# Patient Record
Sex: Female | Born: 1950 | Race: White | Hispanic: No | Marital: Married | Smoking: Never smoker
Health system: Southern US, Community
[De-identification: ages and names within clinical notes are randomized; demographics above are authoritative.]

## PROBLEM LIST (undated history)

## (undated) DIAGNOSIS — R112 Nausea with vomiting, unspecified: Secondary | ICD-10-CM

## (undated) DIAGNOSIS — N952 Postmenopausal atrophic vaginitis: Secondary | ICD-10-CM

## (undated) DIAGNOSIS — Z9889 Other specified postprocedural states: Secondary | ICD-10-CM

## (undated) DIAGNOSIS — M1710 Unilateral primary osteoarthritis, unspecified knee: Secondary | ICD-10-CM

## (undated) DIAGNOSIS — R011 Cardiac murmur, unspecified: Secondary | ICD-10-CM

## (undated) DIAGNOSIS — N6019 Diffuse cystic mastopathy of unspecified breast: Secondary | ICD-10-CM

## (undated) HISTORY — DX: Other specified postprocedural states: Z98.890

## (undated) HISTORY — DX: Postmenopausal atrophic vaginitis: N95.2

## (undated) HISTORY — DX: Unilateral primary osteoarthritis, unspecified knee: M17.10

## (undated) HISTORY — DX: Diffuse cystic mastopathy of unspecified breast: N60.19

## (undated) HISTORY — PX: OTHER SURGICAL HISTORY: SHX169

## (undated) HISTORY — PX: PELVIC LAPAROSCOPY: SHX162

## (undated) HISTORY — DX: Cardiac murmur, unspecified: R01.1

## (undated) HISTORY — PX: OOPHORECTOMY: SHX86

## (undated) HISTORY — PX: DILATION AND CURETTAGE OF UTERUS: SHX78

## (undated) HISTORY — DX: Other specified postprocedural states: R11.2

## (undated) HISTORY — PX: ECTOPIC PREGNANCY SURGERY: SHX613

---
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
## 1982-01-21 HISTORY — PX: OTHER SURGICAL HISTORY: SHX169

## 1982-01-21 HISTORY — PX: BREAST BIOPSY: SHX20

## 1991-01-22 HISTORY — PX: BREAST BIOPSY: SHX20

## 1997-06-28 ENCOUNTER — Other Ambulatory Visit: Admission: RE | Admit: 1997-06-28 | Discharge: 1997-06-28 | Payer: Self-pay | Admitting: Obstetrics and Gynecology

## 1997-11-08 ENCOUNTER — Other Ambulatory Visit: Admission: RE | Admit: 1997-11-08 | Discharge: 1997-11-08 | Payer: Self-pay | Admitting: Obstetrics and Gynecology

## 1998-11-09 ENCOUNTER — Other Ambulatory Visit: Admission: RE | Admit: 1998-11-09 | Discharge: 1998-11-09 | Payer: Self-pay | Admitting: Obstetrics and Gynecology

## 1999-11-20 ENCOUNTER — Other Ambulatory Visit: Admission: RE | Admit: 1999-11-20 | Discharge: 1999-11-20 | Payer: Self-pay | Admitting: Obstetrics and Gynecology

## 2000-11-19 ENCOUNTER — Other Ambulatory Visit: Admission: RE | Admit: 2000-11-19 | Discharge: 2000-11-19 | Payer: Self-pay | Admitting: Obstetrics and Gynecology

## 2001-11-20 ENCOUNTER — Other Ambulatory Visit: Admission: RE | Admit: 2001-11-20 | Discharge: 2001-11-20 | Payer: Self-pay | Admitting: Obstetrics and Gynecology

## 2002-11-30 ENCOUNTER — Other Ambulatory Visit: Admission: RE | Admit: 2002-11-30 | Discharge: 2002-11-30 | Payer: Self-pay | Admitting: Obstetrics and Gynecology

## 2003-12-06 ENCOUNTER — Other Ambulatory Visit: Admission: RE | Admit: 2003-12-06 | Discharge: 2003-12-06 | Payer: Self-pay | Admitting: Obstetrics and Gynecology

## 2004-12-06 ENCOUNTER — Other Ambulatory Visit: Admission: RE | Admit: 2004-12-06 | Discharge: 2004-12-06 | Payer: Self-pay | Admitting: Addiction Medicine

## 2005-12-09 ENCOUNTER — Other Ambulatory Visit: Admission: RE | Admit: 2005-12-09 | Discharge: 2005-12-09 | Payer: Self-pay | Admitting: Obstetrics and Gynecology

## 2006-12-11 ENCOUNTER — Other Ambulatory Visit: Admission: RE | Admit: 2006-12-11 | Discharge: 2006-12-11 | Payer: Self-pay | Admitting: Obstetrics and Gynecology

## 2007-12-02 ENCOUNTER — Ambulatory Visit: Payer: Self-pay | Admitting: Obstetrics and Gynecology

## 2007-12-11 ENCOUNTER — Encounter: Payer: Self-pay | Admitting: Obstetrics and Gynecology

## 2007-12-11 ENCOUNTER — Ambulatory Visit: Payer: Self-pay | Admitting: Obstetrics and Gynecology

## 2007-12-11 ENCOUNTER — Other Ambulatory Visit: Admission: RE | Admit: 2007-12-11 | Discharge: 2007-12-11 | Payer: Self-pay | Admitting: Obstetrics and Gynecology

## 2008-12-08 ENCOUNTER — Ambulatory Visit: Payer: Self-pay | Admitting: Obstetrics and Gynecology

## 2009-01-02 ENCOUNTER — Ambulatory Visit: Payer: Self-pay | Admitting: Obstetrics and Gynecology

## 2009-01-02 ENCOUNTER — Other Ambulatory Visit: Admission: RE | Admit: 2009-01-02 | Discharge: 2009-01-02 | Payer: Self-pay | Admitting: Obstetrics and Gynecology

## 2009-05-04 ENCOUNTER — Encounter (INDEPENDENT_AMBULATORY_CARE_PROVIDER_SITE_OTHER): Payer: Self-pay | Admitting: *Deleted

## 2009-05-05 ENCOUNTER — Ambulatory Visit: Payer: Self-pay | Admitting: Gastroenterology

## 2009-05-24 HISTORY — PX: COLONOSCOPY: SHX174

## 2009-05-29 ENCOUNTER — Telehealth: Payer: Self-pay | Admitting: Gastroenterology

## 2009-06-13 ENCOUNTER — Ambulatory Visit: Payer: Self-pay | Admitting: Gastroenterology

## 2009-07-19 ENCOUNTER — Ambulatory Visit: Payer: Self-pay | Admitting: Obstetrics and Gynecology

## 2009-12-26 ENCOUNTER — Ambulatory Visit: Payer: Self-pay | Admitting: Obstetrics and Gynecology

## 2010-01-03 ENCOUNTER — Other Ambulatory Visit
Admission: RE | Admit: 2010-01-03 | Discharge: 2010-01-03 | Payer: Self-pay | Source: Home / Self Care | Admitting: Obstetrics and Gynecology

## 2010-01-03 ENCOUNTER — Ambulatory Visit: Payer: Self-pay | Admitting: Obstetrics and Gynecology

## 2010-02-20 NOTE — Procedures (Signed)
Summary: Colonoscopy  Patient: [name] Note: All result statuses are Final unless otherwise noted.  Tests: (1) Colonoscopy (COL)   COL Colonoscopy           DONE     [facility]     [address]     [place], [place]  [address]           COLONOSCOPY PROCEDURE REPORT           PATIENT:  Doris Hanson, Doris Hanson  MR#:  [id number]     BIRTHDATE:  02/19/1950, 58 yrs. old  GENDER:  female     ENDOSCOPIST:  Rachael Fee, MD     REF. BY:  Herb Grays, M.D.     PROCEDURE DATE:  06/13/2009     PROCEDURE:  Diagnostic Colonoscopy     ASA CLASS:  Class II     INDICATIONS:  Routine Risk Screening     MEDICATIONS:   Fentanyl 75 mcg IV, Versed 9 mg IV           DESCRIPTION OF PROCEDURE:   After the risks benefits and     alternatives of the procedure were thoroughly explained, informed     consent was obtained.  Digital rectal exam was performed and     revealed no rectal masses.   The LB [id number]-[id number] endoscope     was introduced through the anus and advanced to the cecum, which     was identified by both the appendix and ileocecal valve, without     limitations.  The quality of the prep was excellent, using     MoviPrep.  The instrument was then slowly withdrawn as the colon     was fully examined.     <<PROCEDUREIMAGES>>           FINDINGS:  A normal appearing cecum, ileocecal valve, and     appendiceal orifice were identified. The ascending, hepatic     flexure, transverse, splenic flexure, descending, sigmoid colon,     and rectum appeared unremarkable (see image1, image2, and image3).     Retroflexed views in the rectum revealed no abnormalities.    The     scope was then withdrawn from the patient and the procedure     completed.           COMPLICATIONS:  None           ENDOSCOPIC IMPRESSION:     1) Normal colon     2) No polyps or cancers           RECOMMENDATIONS:     1) Continue current colorectal screening recommendations for     "routine risk" patients with  a repeat colonoscopy in 10 years.           REPEAT EXAM:  10 years           ______________________________     Rachael Fee, MD           n.     eSIGNED:   Rachael Fee at 06/13/2009 11:24 AM           Norm Salt, [id number]  Note: An exclamation mark (!) indicates a result that was not dispersed into the flowsheet. Document Creation Date: 06/13/2009 11:25 AM _______________________________________________________________________  (1) Order result status: Final Collection or observation date-time: 06/13/2009 11:21 Requested date-time:  Receipt date-time:  Reported date-time:  Referring Physician:   Ordering Physician: Rob Bunting [phone]) Specimen Source:  Source: Kem Parkinson  Filler Order Number: [id number] Lab site:   Appended Document: Colonoscopy    Clinical Lists Changes  Observations: Added new observation of COLONNXTDUE: 05/2019 (06/13/2009 13:10)

## 2010-02-20 NOTE — Miscellaneous (Signed)
Summary: [facility]  Clinical Lists Changes  Medications: Added new medication of MOVIPREP 100 GM  SOLR (PEG-KCL-NACL-NASULF-NA ASC-C) As per prep instructions. - Signed Rx of MOVIPREP 100 GM  SOLR (PEG-KCL-NACL-NASULF-NA ASC-C) As per prep instructions.;  #1 x 0;  Signed;  Entered by: Ezra Sites RN;  Authorized by: Rachael Fee MD;  Method used: [facility]  [place] [address], [place], [place]  [address], Ph: [phone] or [phone], Fax: [phone] Allergies: Added new allergy or adverse reaction of HYDROCODONE Observations: Added new observation of NKA: F (05/05/2009 9:58)    Prescriptions: MOVIPREP 100 GM  SOLR (PEG-KCL-NACL-NASULF-NA ASC-C) As per prep instructions.  #1 x 0   Entered by:   Ezra Sites RN   Authorized by:   Rachael Fee MD   Signed by:   Ezra Sites RN on 05/05/2009   Method used:   [facility]        [facility]  [place] [address] (retail)       [address]       [place], [place]  [address]       Ph: [phone] or [phone]       Fax: [phone]   RxID:   [id number]

## 2010-02-20 NOTE — Progress Notes (Signed)
Summary: ? re prep  Phone Note Call from Patient Call back at Home Phone [phone]   Caller: Patient Call For: Christella Hartigan Reason for Call: Talk to Nurse Summary of Call: Patient has questions regarding prep Initial call taken by: Tawni Levy,  May 29, 2009 9:56 AM  Follow-up for Phone Call        Pt. has a meeting the evening before procedure she can not miss and asked if it was ok to take her prep at 8PM.  Advised that it would be ok, but she needed to be sure she has at least a cup of clear liquids every hour while awake to prevent dehydration.  Pt. verbalized understanding of instructions. Follow-up by: Wyona Almas RN,  May 29, 2009 10:11 AM

## 2010-02-20 NOTE — Letter (Signed)
Summary: [facility] Instructions  [facility]  [address] [place], [place] [address]   Phone: [phone]  Fax: [phone]       Doris Hanson    May 19, 1950    MRN: [id number]        Procedure Day /Date: Tuesday May 24/2011     Arrival Time: 9:30 am      Procedure Time: 10:30 am     Location of Procedure:                    _ x_  [facility] (4th Floor)                        PREPARATION FOR COLONOSCOPY WITH MOVIPREP   Starting 5 days prior to your procedure Thursday 5/19 do not eat nuts, seeds, popcorn, corn, beans, peas,  salads, or any raw vegetables.  Do not take any fiber supplements (e.g. Metamucil, Citrucel, and Benefiber).  THE DAY BEFORE YOUR PROCEDURE         DATE: Monday 5/23  1.  Drink clear liquids the entire day-NO SOLID FOOD  2.  Do not drink anything colored red or purple.  Avoid juices with pulp.  No orange juice.  3.  Drink at least 64 oz. (8 glasses) of fluid/clear liquids during the day to prevent dehydration and help the prep work efficiently.  CLEAR LIQUIDS INCLUDE: Water Jello Ice Popsicles Tea (sugar ok, no milk/cream) Powdered fruit flavored drinks Coffee (sugar ok, no milk/cream) Gatorade Juice: apple, white grape, white cranberry  Lemonade Clear bullion, consomm, broth Carbonated beverages (any kind) Strained chicken noodle soup Hard Candy                             4.  In the morning, mix first dose of MoviPrep solution:    Empty 1 Pouch A and 1 Pouch B into the disposable container    Add lukewarm drinking water to the top line of the container. Mix to dissolve    Refrigerate (mixed solution should be used within 24 hrs)  5.  Begin drinking the prep at 5:00 p.m. The MoviPrep container is divided by 4 marks.   Every 15 minutes drink the solution down to the next mark (approximately 8 oz) until the full liter is complete.   6.  Follow completed prep with 16 oz of clear liquid of your choice (Nothing  red or purple).  Continue to drink clear liquids until bedtime.  7.  Before going to bed, mix second dose of MoviPrep solution:    Empty 1 Pouch A and 1 Pouch B into the disposable container    Add lukewarm drinking water to the top line of the container. Mix to dissolve    Refrigerate  THE DAY OF YOUR PROCEDURE      DATE: Tuesday 5/24  Beginning at 5:30 a.m. (5 hours before procedure):         1. Every 15 minutes, drink the solution down to the next mark (approx 8 oz) until the full liter is complete.  2. Follow completed prep with 16 oz. of clear liquid of your choice.    3. You may drink clear liquids until 8:30 am (2 HOURS BEFORE PROCEDURE).   MEDICATION INSTRUCTIONS  Unless otherwise instructed, you should take regular prescription medications with a small sip of water   as early as possible the morning of  your procedure.        OTHER INSTRUCTIONS  You will need a responsible adult at least 58 years of age to accompany you and drive you home.   This person must remain in the waiting room during your procedure.  Wear loose fitting clothing that is easily removed.  Leave jewelry and other valuables at home.  However, you may wish to bring a book to read or  an iPod/MP3 player to listen to music as you wait for your procedure to start.  Remove all body piercing jewelry and leave at home.  Total time from sign-in until discharge is approximately 2-3 hours.  You should go home directly after your procedure and rest.  You can resume normal activities the  day after your procedure.  The day of your procedure you should not:   Drive   Make legal decisions   Operate machinery   Drink alcohol   Return to work  You will receive specific instructions about eating, activities and medications before you leave.    The above instructions have been reviewed and explained to me by   Ezra Sites RN  May 05, 2009 10:27 AM    I fully understand and can verbalize  these instructions _____________________________ Date _________

## 2010-11-19 ENCOUNTER — Telehealth: Payer: Self-pay | Admitting: *Deleted

## 2010-11-19 NOTE — Telephone Encounter (Signed)
Patient calling asking to get her labs done prior to her annual exam appointment on 01/10/11.  She said she usually comes a week prior and gets a FLP and HgbA1C.  Can we order for her?

## 2010-11-19 NOTE — Telephone Encounter (Signed)
Chart please 

## 2010-11-20 NOTE — Telephone Encounter (Signed)
Lm for patient to call

## 2010-11-20 NOTE — Telephone Encounter (Signed)
Gave chart yesterday. I still had encounter open.

## 2010-11-20 NOTE — Telephone Encounter (Signed)
CBC, lipid profile, hemoglobin A1c. Make sure patient is fasted overnight.

## 2010-11-21 ENCOUNTER — Telehealth: Payer: Self-pay | Admitting: *Deleted

## 2010-11-21 DIAGNOSIS — Z0000 Encounter for general adult medical examination without abnormal findings: Secondary | ICD-10-CM

## 2010-11-21 NOTE — Telephone Encounter (Signed)
Per telephone encounter on 11/20/10 DG is okay with pt  having her labs drawn prior to her annual exam. Order placed in computer

## 2010-11-27 NOTE — Telephone Encounter (Signed)
Patient informed. 

## 2010-12-24 DIAGNOSIS — O009 Unspecified ectopic pregnancy without intrauterine pregnancy: Secondary | ICD-10-CM | POA: Insufficient documentation

## 2010-12-25 ENCOUNTER — Encounter: Payer: Self-pay | Admitting: Gynecology

## 2011-01-03 ENCOUNTER — Other Ambulatory Visit (INDEPENDENT_AMBULATORY_CARE_PROVIDER_SITE_OTHER): Payer: BC Managed Care – PPO | Admitting: *Deleted

## 2011-01-03 DIAGNOSIS — Z13220 Encounter for screening for lipoid disorders: Secondary | ICD-10-CM

## 2011-01-03 DIAGNOSIS — Z0000 Encounter for general adult medical examination without abnormal findings: Secondary | ICD-10-CM

## 2011-01-04 LAB — HEMOGLOBIN A1C: Mean Plasma Glucose: 117 mg/dL — ABNORMAL HIGH (ref <117)

## 2011-01-10 ENCOUNTER — Encounter: Payer: Self-pay | Admitting: Obstetrics and Gynecology

## 2011-01-10 ENCOUNTER — Other Ambulatory Visit (HOSPITAL_COMMUNITY)
Admission: RE | Admit: 2011-01-10 | Discharge: 2011-01-10 | Disposition: A | Discharge status: Home / Self Care | Payer: BC Managed Care – PPO | Source: Ambulatory Visit | Attending: Obstetrics and Gynecology | Admitting: Obstetrics and Gynecology

## 2011-01-10 ENCOUNTER — Ambulatory Visit (INDEPENDENT_AMBULATORY_CARE_PROVIDER_SITE_OTHER): Payer: BC Managed Care – PPO | Admitting: Obstetrics and Gynecology

## 2011-01-10 VITALS — BP 120/74 | Ht 62.0 in | Wt 118.0 lb

## 2011-01-10 DIAGNOSIS — R82998 Other abnormal findings in urine: Secondary | ICD-10-CM

## 2011-01-10 DIAGNOSIS — Z01419 Encounter for gynecological examination (general) (routine) without abnormal findings: Secondary | ICD-10-CM | POA: Insufficient documentation

## 2011-01-10 DIAGNOSIS — N952 Postmenopausal atrophic vaginitis: Secondary | ICD-10-CM | POA: Insufficient documentation

## 2011-01-10 MED ORDER — ALENDRONATE SODIUM 70 MG PO TABS: 70.0000 mg | ORAL_TABLET | ORAL | Status: DC

## 2011-01-10 NOTE — Progress Notes (Signed)
Patient came to see me today for her annual GYN exam. She is fine without menopausal symptoms. She is up-to-date on mammograms. She remains on Fosamax for treatment of osteoporosis. She does well on her without side effects. She has been on for 5-6 years. She's had no fractures. She is due for her next bone density in one year. She is having no vaginal bleeding. She is having no pelvic pain. Her triglycerides were slightly elevated this month.  Physical examination:  Doris Hanson present. HEENT within normal limits. Neck: Thyroid not large. No masses. Supraclavicular nodes: not enlarged. Breasts: Examined in both sitting midline position. No skin changes and no masses. Abdomen: Soft no guarding rebound or masses or hernia. Pelvic: External: Within normal limits. BUS: Within normal limits. Vaginal:within normal limits. Good estrogen effect. No evidence of cystocele rectocele or enterocele. Cervix: clean. Uterus: Normal size and shape. Adnexa: No masses. Rectovaginal exam: Confirmatory and negative. Extremities: Within normal limits.  Assessment: #1. Osteoporosis #2. Slightly elevated triglycerides  Plan: Discussed low-fat diet. Recheck lipid profile in 6 months. Continue yearly mammograms. Bone density next year.

## 2011-01-10 NOTE — Progress Notes (Signed)
Addended byCammie Mcgee T on: 01/10/2011 03:19 PM   Modules accepted: Orders

## 2011-01-12 LAB — URINE CULTURE: Colony Count: 2000

## 2011-05-01 ENCOUNTER — Other Ambulatory Visit: Payer: Self-pay | Admitting: *Deleted

## 2011-06-25 ENCOUNTER — Other Ambulatory Visit: Payer: Self-pay | Admitting: Obstetrics and Gynecology

## 2011-06-25 ENCOUNTER — Other Ambulatory Visit: Payer: Self-pay | Admitting: *Deleted

## 2011-06-25 DIAGNOSIS — R928 Other abnormal and inconclusive findings on diagnostic imaging of breast: Secondary | ICD-10-CM

## 2011-06-27 ENCOUNTER — Encounter: Payer: Self-pay | Admitting: Obstetrics and Gynecology

## 2011-07-02 ENCOUNTER — Ambulatory Visit (INDEPENDENT_AMBULATORY_CARE_PROVIDER_SITE_OTHER): Payer: BC Managed Care – PPO | Admitting: Obstetrics and Gynecology

## 2011-07-02 DIAGNOSIS — M810 Age-related osteoporosis without current pathological fracture: Secondary | ICD-10-CM

## 2011-07-02 NOTE — Progress Notes (Signed)
I asked the patient to come back today because of significant bone loss on her last bone density. Her worst T score is now -3.5. Since her previous bone density she has lost 5.4% bone in her spine which is statistically significant. She has been on Fosamax since March of 06 and takes it regularly. She takes adequate calcium and vitamin D. She is a nonsmoker. She drinks alcohol in moderation. She has had no fractures. She has had some back pain which they have attributed to nerve  irritation. It is partially responded to Celebrex. She has not lost any height.  Assessment: Worsening osteoporosis in spite of treatment.  Plan: We went over all the above in detail. Lab work was drawn for secondary causes of  bone loss. She will have her internist x-ray her for a vertebral compression fracture. Assuming all the above is normal we will get her approved for Prolia. Total time of interview was 30 minutes.

## 2011-07-03 LAB — COMPREHENSIVE METABOLIC PANEL
ALT: 16 U/L (ref 0–35)
AST: 21 U/L (ref 0–37)
Albumin: 4.4 g/dL (ref 3.5–5.2)
CO2: 30 mEq/L (ref 19–32)
Calcium: 9.9 mg/dL (ref 8.4–10.5)
Chloride: 102 mEq/L (ref 96–112)
Creat: 0.85 mg/dL (ref 0.50–1.10)
Potassium: 4.4 mEq/L (ref 3.5–5.3)
Sodium: 141 mEq/L (ref 135–145)
Total Protein: 7.1 g/dL (ref 6.0–8.3)

## 2011-07-03 LAB — GLIA (IGA/G) + TTG IGA
Gliadin IgA: 3.7 U/mL (ref <20)
Gliadin IgG: 6.2 U/mL (ref <20)

## 2011-07-15 ENCOUNTER — Telehealth: Payer: Self-pay | Admitting: *Deleted

## 2011-07-15 NOTE — Telephone Encounter (Signed)
Pt asked if order for spine x-ray to be faxed to Dr.Spear office her appointment is tomorrow at 1:30, nancy signed order faxed.

## 2011-07-22 ENCOUNTER — Telehealth: Payer: Self-pay

## 2011-07-22 NOTE — Telephone Encounter (Signed)
You need to send this to Amy.

## 2011-07-22 NOTE — Telephone Encounter (Signed)
PT. NOTIFIED AT HER REQUEST ALL LABS NORMAL AND IS READY TO START PROLIA. SHE STATES SHE HAS PLENTY OF FOSAMAX RX UNTIL WE ARE ABLE TO GET PROLIA FOR HER.

## 2011-07-24 ENCOUNTER — Other Ambulatory Visit: Payer: Self-pay | Admitting: *Deleted

## 2011-07-24 MED ORDER — RISEDRONATE SODIUM 150 MG PO TABS
150.0000 mg | ORAL_TABLET | ORAL | Status: DC
Start: 2011-07-24 — End: 2012-01-17

## 2011-07-24 NOTE — Telephone Encounter (Signed)
Patient advised Prolia benefits.  Total cost would go to deductable. Patient states she does not want to go with this option and wants to know if there is another option besides Fosamax, otherwise for now she will stay with it.

## 2011-07-24 NOTE — Telephone Encounter (Signed)
I think Fosamax is a bad option since she has lost bone on it. Actonel 150 mg monthly would be a reasonable option. She should take it the way she takes Fosamax.

## 2011-07-24 NOTE — Telephone Encounter (Signed)
Patient informed.  Cancelled rx for Fosamax and sent new one to [facility] for Actonel 150mg .

## 2011-07-30 ENCOUNTER — Telehealth: Payer: Self-pay | Admitting: *Deleted

## 2011-07-30 ENCOUNTER — Other Ambulatory Visit: Payer: Self-pay | Admitting: *Deleted

## 2011-07-30 DIAGNOSIS — M810 Age-related osteoporosis without current pathological fracture: Secondary | ICD-10-CM

## 2011-07-30 MED ORDER — DENOSUMAB 60 MG/ML ~~LOC~~ SOLN
60.0000 mg | Freq: Once | SUBCUTANEOUS | Status: AC
  Administered 2012-05-01: 60 mg via SUBCUTANEOUS

## 2011-07-30 NOTE — Telephone Encounter (Signed)
Pt informed with the below note, will let AH know about the below.

## 2011-07-30 NOTE — Progress Notes (Signed)
Patient will have copay of $860.

## 2011-07-30 NOTE — Telephone Encounter (Signed)
Patient has decided to go with Prolia Injection.  Her cost would go straight to deductable.  She would have a $860 copay.  Ordered Prolia today and will call patient when it comes in to set up injection appt.

## 2011-07-30 NOTE — Telephone Encounter (Signed)
First of all Prolia is a 6 month shot. I therefore am  not sure where the number $800 for 2 months came from. Since she had lost bone on Fosamax and Actonel is in the same family I. Would prefer Prolia.

## 2011-07-30 NOTE — Telephone Encounter (Signed)
(  follow up from telephone 07/22/11) Pt was notified by [facility] that the Actonel 150 mg would be $400 for 3 months her prolia would be $800 for 2 months, not much money saved with Actonel, pt would like to know which one you would prefer her to take? Please advise

## 2011-07-31 NOTE — Telephone Encounter (Signed)
LM medication came in today

## 2011-08-06 NOTE — Telephone Encounter (Signed)
Late documentation. LM for pt on 7/12 after she left a message that she got my message regarding her Prolia

## 2011-08-13 ENCOUNTER — Other Ambulatory Visit: Payer: Self-pay | Admitting: *Deleted

## 2011-08-13 DIAGNOSIS — M810 Age-related osteoporosis without current pathological fracture: Secondary | ICD-10-CM

## 2011-08-13 MED ORDER — DENOSUMAB 60 MG/ML ~~LOC~~ SOLN
60.0000 mg | Freq: Once | SUBCUTANEOUS | Status: AC
  Administered 2011-08-14: 60 mg via SUBCUTANEOUS

## 2011-08-13 NOTE — Progress Notes (Signed)
Patient has $860 copay for Prolia.  Patient is aware.

## 2011-08-13 NOTE — Telephone Encounter (Signed)
Patient aware of copay and shot available.  Appt made for 08/14/11.

## 2011-08-14 ENCOUNTER — Ambulatory Visit (INDEPENDENT_AMBULATORY_CARE_PROVIDER_SITE_OTHER): Payer: BC Managed Care – PPO | Admitting: Gynecology

## 2011-08-14 DIAGNOSIS — M810 Age-related osteoporosis without current pathological fracture: Secondary | ICD-10-CM

## 2011-09-20 ENCOUNTER — Telehealth: Payer: Self-pay | Admitting: *Deleted

## 2011-09-20 NOTE — Telephone Encounter (Signed)
Message copied by Richardson Chiquito on Fri Sep 20, 2011  8:39 AM ------      Message from: Ladona Horns E      Created: Thu Sep 19, 2011  2:17 PM       I sent this to have $922.00 written off, patient still owes injection fee of about $42.00 as it was applied to her deductible.PH      ----- Message -----         From: Janus Molder, CMA         Sent: 09/19/2011   1:57 PM           To: Melvenia Beam            I guess I was asking if that's what we could do. Can I call her back and tell her that. I hope there were no others. Maciah Feeback      ----- Message -----         From: Melvenia Beam         Sent: 09/19/2011  12:45 PM           To: Janus Molder, CMA            Yes the prolia is charged out $1782.00. If she paid $880.00 it left her a balance of $922.00. If Amy failed to get this prior authorized we should write that amount off, is that what you are telling  Me ?      ----- Message -----         From: Janus Molder, CMA         Sent: 09/19/2011  12:38 PM           To: Melvenia Beam            This patient received Prolia from our office 08/14/11. When Amy checked benefits, she missed a prior authoriazation. The patient was told by Amy that she would have to pay $860. She paid that but now the patient states she is getting billed an additional $800 plus ( I think she said close to $900) Is this the mark up on the medication? Can you look at anything and let me know? She asked why she would be charged so much. Thanks Sherrilyn Rist

## 2011-09-20 NOTE — Telephone Encounter (Signed)
Pt informed of the write off and she is willing to pay the $42 injection fee.

## 2012-01-17 ENCOUNTER — Encounter: Payer: Self-pay | Admitting: Obstetrics and Gynecology

## 2012-01-17 ENCOUNTER — Ambulatory Visit (INDEPENDENT_AMBULATORY_CARE_PROVIDER_SITE_OTHER): Payer: BC Managed Care – PPO | Admitting: Obstetrics and Gynecology

## 2012-01-17 VITALS — BP 120/72 | Ht 62.0 in | Wt 120.0 lb

## 2012-01-17 DIAGNOSIS — Z01419 Encounter for gynecological examination (general) (routine) without abnormal findings: Secondary | ICD-10-CM

## 2012-01-17 NOTE — Progress Notes (Signed)
Patient came to see me today for her annual GYN exam. She remains amenorrheic after menopause. She requested medication for atrophic vaginitis. She has not previously used medication. She is currently not sexually active due to the vaginal dryness. She is up-to-date on her mammograms. She has had one injection of Prolia. She has done well with that but has had trouble with her nails chipping. She had a normal TSH this summer. Her last bone density was in 2011 and showed osteoporosis. This had happened in spite of 7 years of Fosamax. That is why we switched her medication. She had a normal mammogram this Summer. She has always had normal Pap smears. Her last Pap smear was 2012. In 1984 she had an ectopic pregnancy and had a laparoscopy with a linear left salpingostomy. This had occurred after previous microsurgical fimbrioplasty. The last time she had a lipid panel Was a year ago and was normal except for slightly elevated triglycerides at 176. She did not fast today.  Physical examination:Kim Perman present. HEENT within normal limits. Neck: Thyroid not large. No masses. Supraclavicular nodes: not enlarged. Breasts: Examined in both sitting and lying  position. No skin changes and no masses. Abdomen: Soft no guarding rebound or masses or hernia. Pelvic: External: Within normal limits. BUS: Within normal limits. Vaginal:within normal limits. Poor estrogen effect. No evidence of cystocele rectocele or enterocele. Cervix: clean. Uterus: Normal size and shape. Adnexa: No masses. Rectovaginal exam: Confirmatory and negative. Extremities: Within normal limits.  Assessment: #1. Atrophic vaginitis #2. Elevated triglycerides #3. Osteoporosis  Plan: Continue Prolia. Started patient on Vagifem-daily for 2 weeks then BIW. Return fasting for  lipid panel.Continue yearly mammograms. Plan bone density after 2-3 Prolia injections. Pap not done.The new Pap smear guidelines were discussed with the patient.

## 2012-01-17 NOTE — Patient Instructions (Signed)
Return fasting Monday for lab work.

## 2012-01-18 LAB — URINALYSIS W MICROSCOPIC + REFLEX CULTURE
Bacteria, UA: NONE SEEN
Bilirubin Urine: NEGATIVE
Casts: NONE SEEN
Crystals: NONE SEEN
Ketones, ur: NEGATIVE mg/dL
Nitrite: NEGATIVE
Specific Gravity, Urine: 1.013 (ref 1.005–1.030)
Squamous Epithelial / LPF: NONE SEEN
pH: 6.5 (ref 5.0–8.0)

## 2012-01-20 ENCOUNTER — Other Ambulatory Visit: Payer: BC Managed Care – PPO

## 2012-01-20 DIAGNOSIS — Z01419 Encounter for gynecological examination (general) (routine) without abnormal findings: Secondary | ICD-10-CM

## 2012-01-20 LAB — CBC WITH DIFFERENTIAL/PLATELET
Basophils Absolute: 0 10*3/uL (ref 0.0–0.1)
Basophils Relative: 0 % (ref 0–1)
Eosinophils Absolute: 0.1 10*3/uL (ref 0.0–0.7)
Eosinophils Relative: 2 % (ref 0–5)
HCT: 42.9 % (ref 36.0–46.0)
Hemoglobin: 14.2 g/dL (ref 12.0–15.0)
MCH: 29 pg (ref 26.0–34.0)
MCHC: 33.1 g/dL (ref 30.0–36.0)
Monocytes Absolute: 0.3 10*3/uL (ref 0.1–1.0)
Monocytes Relative: 6 % (ref 3–12)
Neutro Abs: 2.9 10*3/uL (ref 1.7–7.7)
RDW: 13 % (ref 11.5–15.5)

## 2012-01-20 LAB — LIPID PANEL
LDL Cholesterol: 126 mg/dL — ABNORMAL HIGH (ref 0–99)
Triglycerides: 110 mg/dL (ref <150)
VLDL: 22 mg/dL (ref 0–40)

## 2012-02-04 ENCOUNTER — Other Ambulatory Visit: Payer: Self-pay | Admitting: Gynecology

## 2012-02-07 LAB — C-TERMINAL TELOPEPTIDE: C-Telopeptide (CTx): 208 pg/mL

## 2012-02-11 ENCOUNTER — Encounter: Payer: Self-pay | Admitting: *Deleted

## 2012-02-11 NOTE — Progress Notes (Signed)
Patient ID: Doris Hanson, female   DOB: Feb 12, 1950, 62 y.o.   MRN: [id number] Faxed CTX result to [facility]. It will be scanned in as well. I iwll go ahead and get PA for Prolia. She will not proceed til April 2014. KW

## 2012-02-26 ENCOUNTER — Encounter: Payer: Self-pay | Admitting: Gynecology

## 2012-04-28 ENCOUNTER — Telehealth: Payer: Self-pay | Admitting: *Deleted

## 2012-04-28 NOTE — Telephone Encounter (Signed)
Pt was wanting to wait til April to proceed with the Prolia injection. I left a message for the patient to call back so we could discuss. Inusrance covers it, Prior authorization approved til 02/2013, The patient does have a deductible of $2500 before the insurance pays anything.

## 2012-04-29 NOTE — Telephone Encounter (Signed)
Pt informed of cost ~$903. Will come Fri 05/01/12. KW

## 2012-05-01 ENCOUNTER — Ambulatory Visit (INDEPENDENT_AMBULATORY_CARE_PROVIDER_SITE_OTHER): Payer: BC Managed Care – PPO | Admitting: Gynecology

## 2012-05-01 DIAGNOSIS — M810 Age-related osteoporosis without current pathological fracture: Secondary | ICD-10-CM

## 2012-06-25 ENCOUNTER — Encounter: Payer: Self-pay | Admitting: Gynecology

## 2012-10-14 ENCOUNTER — Telehealth: Payer: Self-pay | Admitting: *Deleted

## 2012-10-14 NOTE — Telephone Encounter (Signed)
Pt due for Prolia after 10/31/12. PA on file til 2/15 per Delice Bison. Pt apt is 11/02/12 at [facility]

## 2012-11-02 ENCOUNTER — Ambulatory Visit (INDEPENDENT_AMBULATORY_CARE_PROVIDER_SITE_OTHER): Payer: BC Managed Care – PPO | Admitting: Anesthesiology

## 2012-11-02 DIAGNOSIS — M810 Age-related osteoporosis without current pathological fracture: Secondary | ICD-10-CM

## 2012-11-02 MED ORDER — DENOSUMAB 60 MG/ML ~~LOC~~ SOLN
60.0000 mg | Freq: Once | SUBCUTANEOUS | Status: AC
  Administered 2012-11-02: 60 mg via SUBCUTANEOUS

## 2012-11-03 ENCOUNTER — Telehealth: Payer: Self-pay | Admitting: *Deleted

## 2012-11-03 DIAGNOSIS — Z01419 Encounter for gynecological examination (general) (routine) without abnormal findings: Secondary | ICD-10-CM

## 2012-11-03 NOTE — Telephone Encounter (Signed)
Please see below.

## 2012-11-03 NOTE — Telephone Encounter (Signed)
Orders placed, left on pt voicemail to make lab appointment.

## 2012-11-03 NOTE — Telephone Encounter (Signed)
Message copied by Aura Camps on Tue Nov 03, 2012  9:54 AM ------      Message from: Jerilynn Mages      Created: Mon Nov 02, 2012  9:44 AM      Regarding: lab orders       Patient is schedule to see TF on Dec 30 for her PHY. She wants to due her labs a week before, can you please ask TF for lab orders. Along with regular labs she also wants chol test and A1C. Thx ------

## 2012-11-03 NOTE — Telephone Encounter (Signed)
CBC, comprehensive metabolic panel, lipid profile, hemoglobin A1c, TSH vitamin D

## 2013-01-11 ENCOUNTER — Other Ambulatory Visit: Payer: BC Managed Care – PPO

## 2013-01-11 DIAGNOSIS — Z01419 Encounter for gynecological examination (general) (routine) without abnormal findings: Secondary | ICD-10-CM

## 2013-01-11 LAB — CBC WITH DIFFERENTIAL/PLATELET
Hemoglobin: 14.4 g/dL (ref 12.0–15.0)
Lymphs Abs: 1.7 10*3/uL (ref 0.7–4.0)
MCH: 29 pg (ref 26.0–34.0)
Monocytes Relative: 9 % (ref 3–12)
Neutro Abs: 2.3 10*3/uL (ref 1.7–7.7)
Neutrophils Relative %: 52 % (ref 43–77)
RBC: 4.97 MIL/uL (ref 3.87–5.11)

## 2013-01-11 LAB — COMPREHENSIVE METABOLIC PANEL
Albumin: 4.3 g/dL (ref 3.5–5.2)
CO2: 28 mEq/L (ref 19–32)
Chloride: 105 mEq/L (ref 96–112)
Glucose, Bld: 88 mg/dL (ref 70–99)
Potassium: 4.3 mEq/L (ref 3.5–5.3)
Sodium: 141 mEq/L (ref 135–145)
Total Protein: 6.6 g/dL (ref 6.0–8.3)

## 2013-01-11 LAB — LIPID PANEL
Cholesterol: 213 mg/dL — ABNORMAL HIGH (ref 0–200)
HDL: 74 mg/dL (ref >39)
LDL Cholesterol: 117 mg/dL — ABNORMAL HIGH (ref 0–99)
Total CHOL/HDL Ratio: 2.9 Ratio
Triglycerides: 111 mg/dL (ref <150)
VLDL: 22 mg/dL (ref 0–40)

## 2013-01-11 LAB — TSH: TSH: 1.791 u[IU]/mL (ref 0.350–4.500)

## 2013-01-19 ENCOUNTER — Telehealth: Payer: Self-pay | Admitting: *Deleted

## 2013-01-19 ENCOUNTER — Encounter: Payer: Self-pay | Admitting: Gynecology

## 2013-01-19 ENCOUNTER — Ambulatory Visit (INDEPENDENT_AMBULATORY_CARE_PROVIDER_SITE_OTHER): Payer: BC Managed Care – PPO | Admitting: Gynecology

## 2013-01-19 VITALS — BP 118/70 | Ht 62.0 in | Wt 122.0 lb

## 2013-01-19 DIAGNOSIS — M810 Age-related osteoporosis without current pathological fracture: Secondary | ICD-10-CM

## 2013-01-19 DIAGNOSIS — N952 Postmenopausal atrophic vaginitis: Secondary | ICD-10-CM

## 2013-01-19 DIAGNOSIS — Z01419 Encounter for gynecological examination (general) (routine) without abnormal findings: Secondary | ICD-10-CM

## 2013-01-19 MED ORDER — NONFORMULARY OR COMPOUNDED ITEM: Status: DC

## 2013-01-19 NOTE — Telephone Encounter (Signed)
rx called in

## 2013-01-19 NOTE — Telephone Encounter (Signed)
Message copied by Aura Camps on Tue Jan 19, 2013  3:09 PM ------      Message from: Dara Lords      Created: Tue Jan 19, 2013  3:05 PM       Call in customer care pharmacy formulated vaginal estrogen cream twice weekly, 3 month supply, refill x1 year ------

## 2013-01-19 NOTE — Progress Notes (Signed)
Fradel Baldonado 06/17/1950 [id number]        62 y.o.  G2P0020 for annual exam.  Former patient of Dr. Eda Paschal. Several issues noted below.  Past medical history,surgical history, problem list, medications, allergies, family history and social history were all reviewed and documented in the EPIC chart.  ROS:  Performed and pertinent positives and negatives are included in the history, assessment and plan .  Exam: Kim assistant Filed Vitals:   01/19/13 1420  BP: 118/70  Height: 5\' 2"  (1.575 m)  Weight: 122 lb (55.339 kg)   General appearance  Normal Skin grossly normal Head/Neck normal with no cervical or supraclavicular adenopathy thyroid normal Lungs  clear Cardiac RR, without RMG Abdominal  soft, nontender, without masses, organomegaly or hernia Breasts  examined lying and sitting without masses, retractions, discharge or axillary adenopathy. Pelvic  Ext/BUS/vagina  Normal with atrophic changes  Cervix  Normal with atrophic changes  Uterus  anteverted, normal size, shape and contour, midline and mobile nontender   Adnexa  Without masses or tenderness    Anus and perineum  Normal   Rectovaginal  Normal sphincter tone without palpated masses or tenderness.    Assessment/Plan:  62 y.o. G25P0020 female for annual exam.   1. Postmenopausal/atrophic genital changes. Not having problems with significant hot flashes or night sweats. He is having issues with vaginal dryness and dyspareunia. Was prescribed Vagifem last year but did not continue due to cost. Options to include OTC moisturizers/lubricants, vaginal estrogen cream, Vagifem and Osphena reviewed. Risks/benefits, pros/cons of each choice reviewed. Ultimately patient wants to try bioequivalent vaginal estrogen per custom care pharmacy twice weekly. Issues of absorption and risks to include stroke heart attack DVT breast cancer and uterine stimulation reviewed. Patient knows to call if any vaginal bleeding. Followup if any  issues. 2. Osteoporosis. DEXA 05/2011 with T score -3.5. Had been on Fosamax for 7 years ending 2013. Due to continued decline patient was switched to Prolia and historically has received 3 injections. Had secondary workup to include comprehensive metabolic panel, PTH, TSH, vitamin D and celiac antibodies all negative. Risks reviewed to include osteonecrosis of the jaw, atypical fractures particularly with prolonged use, rashes, infections all reviewed. Patient will continue on Prolia when due and repeat her DEXA this coming spring at a 2 year interval. Increase calcium vitamin D reviewed. 3. Mammography 06/2012. Continue with annual mammography. SBE monthly reviewed. 4. Pap smear 2012. No Pap smear done today. No history of significant abnormal Pap smears. Plan repeat Pap smear next year at 3 year interval. 5. Colonoscopy 2011. Repeat at their recommended interval. 6. Health maintenance. Patient had baseline labs from last week which showed a borderline cholesterol and LDL as well as a borderline glucose and marginal hemoglobin A1c. Recommend that she establish care with a primary physician to follow on an ongoing basis as treatment may be necessary she agrees to call and make appointment to see someone in [facility] group. Assuming she does well with the vaginal estrogen cream that she'll see me in a year, sooner as needed. She will get her bone density scheduled in the spring at [facility] where her prior bone density was done.   Note: This document was prepared with digital dictation and possible smart phrase technology. Any transcriptional errors that result from this process are unintentional.   Dara Lords MD, 3:00 PM 01/19/2013

## 2013-01-19 NOTE — Patient Instructions (Signed)
Continue with Prolia every 6 months. Followup for bone density after May 2015. Start on the vaginal estrogen cream [facility]. Call me if any vaginal bleeding or any other issues. Followup in one year for annual exam.

## 2013-01-27 ENCOUNTER — Other Ambulatory Visit: Payer: Self-pay | Admitting: Gynecology

## 2013-01-27 DIAGNOSIS — Z01419 Encounter for gynecological examination (general) (routine) without abnormal findings: Secondary | ICD-10-CM

## 2013-04-07 ENCOUNTER — Ambulatory Visit: Payer: BC Managed Care – PPO

## 2013-05-06 ENCOUNTER — Ambulatory Visit (INDEPENDENT_AMBULATORY_CARE_PROVIDER_SITE_OTHER): Payer: BC Managed Care – PPO | Admitting: Gynecology

## 2013-05-06 DIAGNOSIS — M810 Age-related osteoporosis without current pathological fracture: Secondary | ICD-10-CM

## 2013-05-06 MED ORDER — DENOSUMAB 60 MG/ML ~~LOC~~ SOLN
60.0000 mg | Freq: Once | SUBCUTANEOUS | Status: AC
  Administered 2013-05-06: 60 mg via SUBCUTANEOUS

## 2013-07-02 ENCOUNTER — Encounter: Payer: Self-pay | Admitting: Gynecology

## 2013-07-02 ENCOUNTER — Telehealth: Payer: Self-pay | Admitting: Gynecology

## 2013-07-02 NOTE — Telephone Encounter (Signed)
Tell patient that her bone density looks improved and I would stay on the Prolia.

## 2013-07-02 NOTE — Telephone Encounter (Signed)
Left detailed message with the below on voicemail 

## 2013-10-26 ENCOUNTER — Telehealth: Payer: Self-pay | Admitting: *Deleted

## 2013-10-26 DIAGNOSIS — Z01419 Encounter for gynecological examination (general) (routine) without abnormal findings: Secondary | ICD-10-CM

## 2013-10-26 NOTE — Telephone Encounter (Signed)
Disclaimer is she may need to have additional blood drawn at her visit if something comes up that I want to check outside the "routine" blood work. Order CBC, comprehensive metabolic panel, lipid profile, TSH, vitamin D, urinalysis

## 2013-10-26 NOTE — Telephone Encounter (Signed)
Pt has annual schedule don 01/26/14 would like lab done prior to annual to discuss at visit. Please advise

## 2013-10-26 NOTE — Telephone Encounter (Signed)
Pt informed, orders placed, pt will come on 01/18/14 @ 9:00 am

## 2013-10-29 ENCOUNTER — Telehealth: Payer: Self-pay | Admitting: *Deleted

## 2013-10-29 NOTE — Telephone Encounter (Signed)
Pt was a left a message that she is due for her next Prolia. She is responsible for $1000 due to deductible not being met yet. Injection due after 11/05/13 KW CMA LM to call to schedule apt. KW CMA

## 2013-11-03 ENCOUNTER — Telehealth: Payer: Self-pay | Admitting: *Deleted

## 2013-11-03 NOTE — Telephone Encounter (Signed)
Pt called to schedule Prolia injection and wanted to talk to about cost. Pt responsible for full price since deductible not met. Last injection the patient paid $931.26. She is okay with that. Prior auth good til 04/21/14 #[id number]. Injection made for 11/12/14 9am. KW CMA

## 2013-11-11 ENCOUNTER — Ambulatory Visit: Payer: BC Managed Care – PPO

## 2013-11-12 ENCOUNTER — Ambulatory Visit (INDEPENDENT_AMBULATORY_CARE_PROVIDER_SITE_OTHER): Payer: BC Managed Care – PPO | Admitting: Gynecology

## 2013-11-12 DIAGNOSIS — M899 Disorder of bone, unspecified: Secondary | ICD-10-CM

## 2013-11-12 DIAGNOSIS — M8580 Other specified disorders of bone density and structure, unspecified site: Secondary | ICD-10-CM

## 2013-11-12 MED ORDER — DENOSUMAB 60 MG/ML ~~LOC~~ SOLN
60.0000 mg | Freq: Once | SUBCUTANEOUS | Status: AC
  Administered 2013-11-12: 60 mg via SUBCUTANEOUS

## 2013-11-22 ENCOUNTER — Encounter: Payer: Self-pay | Admitting: Gynecology

## 2014-01-18 ENCOUNTER — Other Ambulatory Visit: Payer: BC Managed Care – PPO

## 2014-01-18 DIAGNOSIS — Z01419 Encounter for gynecological examination (general) (routine) without abnormal findings: Secondary | ICD-10-CM

## 2014-01-18 LAB — LIPID PANEL
CHOL/HDL RATIO: 3.3 ratio
CHOLESTEROL: 202 mg/dL — AB (ref 0–200)
HDL: 62 mg/dL (ref >39)
LDL Cholesterol: 112 mg/dL — ABNORMAL HIGH (ref 0–99)
Triglycerides: 138 mg/dL (ref <150)
VLDL: 28 mg/dL (ref 0–40)

## 2014-01-18 LAB — CBC WITH DIFFERENTIAL/PLATELET
Basophils Absolute: 0 10*3/uL (ref 0.0–0.1)
Basophils Relative: 0 % (ref 0–1)
EOS ABS: 0.1 10*3/uL (ref 0.0–0.7)
EOS PCT: 1 % (ref 0–5)
HEMATOCRIT: 43.2 % (ref 36.0–46.0)
HEMOGLOBIN: 14.5 g/dL (ref 12.0–15.0)
LYMPHS PCT: 20 % (ref 12–46)
Lymphs Abs: 1.6 10*3/uL (ref 0.7–4.0)
MCH: 29.5 pg (ref 26.0–34.0)
MCHC: 33.6 g/dL (ref 30.0–36.0)
MCV: 88 fL (ref 78.0–100.0)
MONO ABS: 0.5 10*3/uL (ref 0.1–1.0)
MONOS PCT: 6 % (ref 3–12)
MPV: 9.9 fL (ref 8.6–12.4)
Neutro Abs: 5.7 10*3/uL (ref 1.7–7.7)
Neutrophils Relative %: 73 % (ref 43–77)
Platelets: 325 10*3/uL (ref 150–400)
RBC: 4.91 MIL/uL (ref 3.87–5.11)
RDW: 12.7 % (ref 11.5–15.5)
WBC: 7.8 10*3/uL (ref 4.0–10.5)

## 2014-01-18 LAB — COMPREHENSIVE METABOLIC PANEL
ALBUMIN: 4.1 g/dL (ref 3.5–5.2)
ALK PHOS: 63 U/L (ref 39–117)
ALT: 23 U/L (ref 0–35)
AST: 20 U/L (ref 0–37)
BUN: 16 mg/dL (ref 6–23)
CO2: 31 meq/L (ref 19–32)
CREATININE: 0.77 mg/dL (ref 0.50–1.10)
Calcium: 9.7 mg/dL (ref 8.4–10.5)
Chloride: 101 mEq/L (ref 96–112)
GLUCOSE: 90 mg/dL (ref 70–99)
Potassium: 4.1 mEq/L (ref 3.5–5.3)
SODIUM: 140 meq/L (ref 135–145)
Total Bilirubin: 0.5 mg/dL (ref 0.2–1.2)
Total Protein: 6.8 g/dL (ref 6.0–8.3)

## 2014-01-18 LAB — TSH: TSH: 1.735 u[IU]/mL (ref 0.350–4.500)

## 2014-01-18 LAB — VITAMIN D 25 HYDROXY (VIT D DEFICIENCY, FRACTURES): VIT D 25 HYDROXY: 48 ng/mL (ref 30–100)

## 2014-01-25 ENCOUNTER — Encounter: Payer: Self-pay | Admitting: Gynecology

## 2014-01-25 ENCOUNTER — Telehealth: Payer: Self-pay | Admitting: *Deleted

## 2014-01-25 ENCOUNTER — Other Ambulatory Visit (HOSPITAL_COMMUNITY)
Admission: RE | Admit: 2014-01-25 | Discharge: 2014-01-25 | Disposition: A | Discharge status: Home / Self Care | Payer: BLUE CROSS/BLUE SHIELD | Source: Ambulatory Visit | Attending: Gynecology | Admitting: Gynecology

## 2014-01-25 ENCOUNTER — Ambulatory Visit (INDEPENDENT_AMBULATORY_CARE_PROVIDER_SITE_OTHER): Payer: BLUE CROSS/BLUE SHIELD | Admitting: Gynecology

## 2014-01-25 VITALS — BP 116/74 | Ht 62.0 in | Wt 119.0 lb

## 2014-01-25 DIAGNOSIS — Z01419 Encounter for gynecological examination (general) (routine) without abnormal findings: Secondary | ICD-10-CM

## 2014-01-25 DIAGNOSIS — N952 Postmenopausal atrophic vaginitis: Secondary | ICD-10-CM

## 2014-01-25 MED ORDER — NONFORMULARY OR COMPOUNDED ITEM: Status: DC

## 2014-01-25 NOTE — Addendum Note (Signed)
Addended by: Dayna BarkerGARDNER, Jamin Panther K on: 01/25/2014 09:48 AM   Modules accepted: Orders, SmartSet

## 2014-01-25 NOTE — Telephone Encounter (Signed)
Rx called in 

## 2014-01-25 NOTE — Progress Notes (Signed)
Doris Hanson 04/07/1950 [id number]        64 y.o.  G2P0020 for annual exam.  Several issues noted below.  Past medical history,surgical history, problem list, medications, allergies, family history and social history were all reviewed and documented as reviewed in the EPIC chart.  ROS:  Performed with pertinent positives and negatives included in the history, assessment and plan.   Additional significant findings :  none   Exam: [facility] personassistant Filed Vitals:   01/25/14 0842  BP: 116/74  Height: 5\' 2"  (1.575 m)  Weight: 119 lb (53.978 kg)   General appearance:  Normal affect, orientation and appearance. Skin: Grossly normal HEENT: Without gross lesions.  No cervical or supraclavicular adenopathy. Thyroid normal.  Lungs:  Clear without wheezing, rales or rhonchi Cardiac: RR, without RMG Abdominal:  Soft, nontender, without masses, guarding, rebound, organomegaly or hernia Breasts:  Examined lying and sitting without masses, retractions, discharge or axillary adenopathy. Pelvic:  Ext/BUS/vagina with atrophic changes  Cervix with atrophic changes  Pap done  Uterus anteverted, normal size, shape and contour, midline and mobile nontender   Adnexa  Without masses or tenderness    Anus and perineum  Normal   Rectovaginal  Normal sphincter tone without palpated masses or tenderness.    Assessment/Plan:  64 y.o. 722P0020 female for annual exam.   1. Postmenopausal/atrophic genital changes. Patient continues on bioidentical estradiol vaginal cream twice weekly. Doing well with this and wants to continue. I again reviewed the risks of absorption with stroke heart attack DVT endometrial stimulation breast cancer reviewed. Patient's comfortable continuing and I refilled her 1 year. No vaginal bleeding. Patient knows to report any vaginal bleeding. 2. Pap smear 2012. Pap done today. No history of abnormal Pap smears previously. 3. Mammography 06/2013. Continue with annual mammography. SBE  monthly reviewed. 4. Osteoporosis.  DEXA 06/2013 T score -3.1. Statistically significant increase from last bone density. Currently on Prolia.  Had been on Fosamax for 7 years ending 2013. Had continued decline and was switched to Prolia. Due this coming spring. Going into her third year. Will continue now and tentatively plan through her next DEXA and then consider a drug-free holiday. Patient knows importance of following up for her injection when due.  Recent vitamin D normal at 48 5. Colonoscopy 2011. Repeat at their recommended interval. 6. Health maintenance.  Recent blood work normal with the exception of cholesterol 202 and LDL 112. HDL 62. Beginning a new exercise program. Reviewed issues of cholesterol medication to lower LDL below 100 versus monitoring at present. Ultimately patients comfortable with following at this point and again starting a more consistent exercise program. Follow up in one year, sooner as needed.     Dara LordsFONTAINE,Moustapha Tooker P MD, 9:11 AM 01/25/2014

## 2014-01-25 NOTE — Telephone Encounter (Signed)
-----   Message from Dara Lordsimothy P Fontaine, MD sent at 01/25/2014 10:03 AM EST ----- [facility]ill [facility] vaginal estradiol 1 year

## 2014-01-25 NOTE — Patient Instructions (Signed)
You may obtain a copy of any labs that were done today by logging onto MyChart as outlined in the instructions provided with your AVS (after visit summary). The office will not call with normal lab results but certainly if there are any significant abnormalities then we will contact you.   Health Maintenance, Female A healthy lifestyle and preventative care can promote health and wellness.  Maintain regular health, dental, and eye exams.  Eat a healthy diet. Foods like vegetables, fruits, whole grains, low-fat dairy products, and lean protein foods contain the nutrients you need without too many calories. Decrease your intake of foods high in solid fats, added sugars, and salt. Get information about a proper diet from your caregiver, if necessary.  Regular physical exercise is one of the most important things you can do for your health. Most adults should get at least 150 minutes of moderate-intensity exercise (any activity that increases your heart rate and causes you to sweat) each week. In addition, most adults need muscle-strengthening exercises on 2 or more days a week.   Maintain a healthy weight. The body mass index (BMI) is a screening tool to identify possible weight problems. It provides an estimate of body fat based on height and weight. Your caregiver can help determine your BMI, and can help you achieve or maintain a healthy weight. For adults 20 years and older:  A BMI below 18.5 is considered underweight.  A BMI of 18.5 to 24.9 is normal.  A BMI of 25 to 29.9 is considered overweight.  A BMI of 30 and above is considered obese.  Maintain normal blood lipids and cholesterol by exercising and minimizing your intake of saturated fat. Eat a balanced diet with plenty of fruits and vegetables. Blood tests for lipids and cholesterol should begin at age 61 and be repeated every 5 years. If your lipid or cholesterol levels are high, you are over 50, or you are a high risk for heart  disease, you may need your cholesterol levels checked more frequently.Ongoing high lipid and cholesterol levels should be treated with medicines if diet and exercise are not effective.  If you smoke, find out from your caregiver how to quit. If you do not use tobacco, do not start.  Lung cancer screening is recommended for adults aged 33 80 years who are at high risk for developing lung cancer because of a history of smoking. Yearly low-dose computed tomography (CT) is recommended for people who have at least a 30-pack-year history of smoking and are a current smoker or have quit within the past 15 years. A pack year of smoking is smoking an average of 1 pack of cigarettes a day for 1 year (for example: 1 pack a day for 30 years or 2 packs a day for 15 years). Yearly screening should continue until the smoker has stopped smoking for at least 15 years. Yearly screening should also be stopped for people who develop a health problem that would prevent them from having lung cancer treatment.  If you are pregnant, do not drink alcohol. If you are breastfeeding, be very cautious about drinking alcohol. If you are not pregnant and choose to drink alcohol, do not exceed 1 drink per day. One drink is considered to be 12 ounces (355 mL) of beer, 5 ounces (148 mL) of wine, or 1.5 ounces (44 mL) of liquor.  Avoid use of street drugs. Do not share needles with anyone. Ask for help if you need support or instructions about stopping  the use of drugs.  High blood pressure causes heart disease and increases the risk of stroke. Blood pressure should be checked at least every 1 to 2 years. Ongoing high blood pressure should be treated with medicines, if weight loss and exercise are not effective.  If you are 59 to 62 years old, ask your caregiver if you should take aspirin to prevent strokes.  Diabetes screening involves taking a blood sample to check your fasting blood sugar level. This should be done once every 3  years, after age 91, if you are within normal weight and without risk factors for diabetes. Testing should be considered at a younger age or be carried out more frequently if you are overweight and have at least 1 risk factor for diabetes.  Breast cancer screening is essential preventative care for women. You should practice "breast self-awareness." This means understanding the normal appearance and feel of your breasts and may include breast self-examination. Any changes detected, no matter how small, should be reported to a caregiver. Women in their 66s and 30s should have a clinical breast exam (CBE) by a caregiver as part of a regular health exam every 1 to 3 years. After age 101, women should have a CBE every year. Starting at age 100, women should consider having a mammogram (breast X-ray) every year. Women who have a family history of breast cancer should talk to their caregiver about genetic screening. Women at a high risk of breast cancer should talk to their caregiver about having an MRI and a mammogram every year.  Breast cancer gene (BRCA)-related cancer risk assessment is recommended for women who have family members with BRCA-related cancers. BRCA-related cancers include breast, ovarian, tubal, and peritoneal cancers. Having family members with these cancers may be associated with an increased risk for harmful changes (mutations) in the breast cancer genes BRCA1 and BRCA2. Results of the assessment will determine the need for genetic counseling and BRCA1 and BRCA2 testing.  The Pap test is a screening test for cervical cancer. Women should have a Pap test starting at age 57. Between ages 25 and 35, Pap tests should be repeated every 2 years. Beginning at age 37, you should have a Pap test every 3 years as long as the past 3 Pap tests have been normal. If you had a hysterectomy for a problem that was not cancer or a condition that could lead to cancer, then you no longer need Pap tests. If you are  between ages 50 and 76, and you have had normal Pap tests going back 10 years, you no longer need Pap tests. If you have had past treatment for cervical cancer or a condition that could lead to cancer, you need Pap tests and screening for cancer for at least 20 years after your treatment. If Pap tests have been discontinued, risk factors (such as a new sexual partner) need to be reassessed to determine if screening should be resumed. Some women have medical problems that increase the chance of getting cervical cancer. In these cases, your caregiver may recommend more frequent screening and Pap tests.  The human papillomavirus (HPV) test is an additional test that may be used for cervical cancer screening. The HPV test looks for the virus that can cause the cell changes on the cervix. The cells collected during the Pap test can be tested for HPV. The HPV test could be used to screen women aged 44 years and older, and should be used in women of any age  who have unclear Pap test results. After the age of 55, women should have HPV testing at the same frequency as a Pap test.  Colorectal cancer can be detected and often prevented. Most routine colorectal cancer screening begins at the age of 44 and continues through age 20. However, your caregiver may recommend screening at an earlier age if you have risk factors for colon cancer. On a yearly basis, your caregiver may provide home test kits to check for hidden blood in the stool. Use of a small camera at the end of a tube, to directly examine the colon (sigmoidoscopy or colonoscopy), can detect the earliest forms of colorectal cancer. Talk to your caregiver about this at age 86, when routine screening begins. Direct examination of the colon should be repeated every 5 to 10 years through age 13, unless early forms of pre-cancerous polyps or small growths are found.  Hepatitis C blood testing is recommended for all people born from [birth year] through 1963 and any  individual with known risks for hepatitis C.  Practice safe sex. Use condoms and avoid high-risk sexual practices to reduce the spread of sexually transmitted infections (STIs). Sexually active women aged 36 and younger should be checked for Chlamydia, which is a common sexually transmitted infection. Older women with new or multiple partners should also be tested for Chlamydia. Testing for other STIs is recommended if you are sexually active and at increased risk.  Osteoporosis is a disease in which the bones lose minerals and strength with aging. This can result in serious bone fractures. The risk of osteoporosis can be identified using a bone density scan. Women ages 20 and over and women at risk for fractures or osteoporosis should discuss screening with their caregivers. Ask your caregiver whether you should be taking a calcium supplement or vitamin D to reduce the rate of osteoporosis.  Menopause can be associated with physical symptoms and risks. Hormone replacement therapy is available to decrease symptoms and risks. You should talk to your caregiver about whether hormone replacement therapy is right for you.  Use sunscreen. Apply sunscreen liberally and repeatedly throughout the day. You should seek shade when your shadow is shorter than you. Protect yourself by wearing long sleeves, pants, a wide-brimmed hat, and sunglasses year round, whenever you are outdoors.  Notify your caregiver of new moles or changes in moles, especially if there is a change in shape or color. Also notify your caregiver if a mole is larger than the size of a pencil eraser.  Stay current with your immunizations. Document Released: 07/23/2010 Document Revised: 05/04/2012 Document Reviewed: 07/23/2010 [facility] Patient Information 2014 [employer].

## 2014-01-26 ENCOUNTER — Encounter: Payer: BC Managed Care – PPO | Admitting: Gynecology

## 2014-01-26 LAB — CYTOLOGY - PAP

## 2014-04-26 ENCOUNTER — Telehealth: Payer: Self-pay | Admitting: Gynecology

## 2014-04-26 NOTE — Telephone Encounter (Signed)
Prolia injection due. Ins benefits Deductible $2700 ($0) met. PA needed . Once deduc is met 100% coverage . Will offer information regarding Prolia benefit card. Left message on voicemail.

## 2014-04-28 NOTE — Telephone Encounter (Signed)
[facility] due to high Deductible ($2700). She is interested and understands her requirements for card and documentation needed for Prolia benefits. Will call Prolia and then [facility]

## 2014-05-24 NOTE — Telephone Encounter (Signed)
[place] BC[employer] approval reference # [id number] 05-03-14 thru 05/03/15.  Prolia benefit card approved # [id number]  05/24/14 thru 05/25/15. Calcium level 12/2013 9.7.  Appointment for Prolia injection 05/30/14 . Patient understands Prolia benefit wavier documentation and will sign .

## 2014-05-30 ENCOUNTER — Ambulatory Visit (INDEPENDENT_AMBULATORY_CARE_PROVIDER_SITE_OTHER): Payer: BLUE CROSS/BLUE SHIELD | Admitting: Gynecology

## 2014-05-30 DIAGNOSIS — M810 Age-related osteoporosis without current pathological fracture: Secondary | ICD-10-CM

## 2014-05-30 MED ORDER — DENOSUMAB 60 MG/ML ~~LOC~~ SOLN
60.0000 mg | Freq: Once | SUBCUTANEOUS | Status: AC
  Administered 2014-05-30: 60 mg via SUBCUTANEOUS

## 2014-05-31 NOTE — Telephone Encounter (Signed)
Injection given on 05/30/14. Next injection due after 12/01/14.

## 2014-07-05 ENCOUNTER — Encounter: Payer: Self-pay | Admitting: Gynecology

## 2014-09-29 ENCOUNTER — Encounter: Payer: Self-pay | Admitting: Gastroenterology

## 2014-11-08 ENCOUNTER — Telehealth: Payer: Self-pay | Admitting: Gynecology

## 2014-11-08 NOTE — Telephone Encounter (Signed)
Phone call to pt Prolia due after 12/01/14. $0 cost pt, Benefits deductible $2700 ($2700 met), no co pay no co insurance. PA needed ref# [id number],  05/03/14 thru 05/03/2015. Prolia benefit card #[id number]  05/24/2014 thru 05/25/2015. Calcium 12/2013 9.7 Sch appointment for 12/05/14. 

## 2014-12-05 ENCOUNTER — Ambulatory Visit: Payer: BLUE CROSS/BLUE SHIELD

## 2014-12-06 ENCOUNTER — Ambulatory Visit (INDEPENDENT_AMBULATORY_CARE_PROVIDER_SITE_OTHER): Payer: BLUE CROSS/BLUE SHIELD | Admitting: Gynecology

## 2014-12-06 DIAGNOSIS — M810 Age-related osteoporosis without current pathological fracture: Secondary | ICD-10-CM | POA: Diagnosis not present

## 2014-12-06 MED ORDER — DENOSUMAB 60 MG/ML ~~LOC~~ SOLN
60.0000 mg | Freq: Once | SUBCUTANEOUS | Status: AC
  Administered 2014-12-06: 60 mg via SUBCUTANEOUS

## 2014-12-08 NOTE — Telephone Encounter (Signed)
Injection prolia 12/06/14  Next injection due after 06/06/14.

## 2014-12-12 NOTE — Telephone Encounter (Signed)
Next prolia injection due after 06/06/15

## 2015-01-24 ENCOUNTER — Telehealth: Payer: Self-pay | Admitting: *Deleted

## 2015-01-24 DIAGNOSIS — Z1329 Encounter for screening for other suspected endocrine disorder: Secondary | ICD-10-CM

## 2015-01-24 DIAGNOSIS — Z01419 Encounter for gynecological examination (general) (routine) without abnormal findings: Secondary | ICD-10-CM

## 2015-01-24 DIAGNOSIS — Z13220 Encounter for screening for lipoid disorders: Secondary | ICD-10-CM

## 2015-01-24 DIAGNOSIS — R7309 Other abnormal glucose: Secondary | ICD-10-CM

## 2015-01-24 DIAGNOSIS — Z1321 Encounter for screening for nutritional disorder: Secondary | ICD-10-CM

## 2015-01-24 NOTE — Telephone Encounter (Signed)
Orders placed, pt will schedule lab appointment

## 2015-01-24 NOTE — Telephone Encounter (Signed)
Pt called is due to annual this month, no scheduled yet trying to get off from work. Pt would like to schedule annual labs prior to appointment. Pt did mention she would like hemoglobin A1C checked as well. Please advise

## 2015-01-24 NOTE — Telephone Encounter (Signed)
Okay for CBC, comprehensive metabolic panel, lipid profile, TSH, vitamin D, hemoglobin A1c, urinalysis 

## 2015-01-27 ENCOUNTER — Other Ambulatory Visit: Payer: BLUE CROSS/BLUE SHIELD

## 2015-01-27 ENCOUNTER — Encounter: Payer: BLUE CROSS/BLUE SHIELD | Admitting: Gynecology

## 2015-01-27 DIAGNOSIS — Z1321 Encounter for screening for nutritional disorder: Secondary | ICD-10-CM

## 2015-01-27 DIAGNOSIS — Z01419 Encounter for gynecological examination (general) (routine) without abnormal findings: Secondary | ICD-10-CM

## 2015-01-27 DIAGNOSIS — Z1329 Encounter for screening for other suspected endocrine disorder: Secondary | ICD-10-CM

## 2015-01-27 DIAGNOSIS — Z13220 Encounter for screening for lipoid disorders: Secondary | ICD-10-CM

## 2015-01-27 DIAGNOSIS — R7309 Other abnormal glucose: Secondary | ICD-10-CM

## 2015-01-27 LAB — LIPID PANEL
CHOL/HDL RATIO: 2.3 ratio (ref <=5.0)
Cholesterol: 210 mg/dL — ABNORMAL HIGH (ref 125–200)
HDL: 93 mg/dL (ref >=46)
LDL CALC: 100 mg/dL (ref <130)
TRIGLYCERIDES: 85 mg/dL (ref <150)
VLDL: 17 mg/dL (ref <30)

## 2015-01-27 LAB — CBC WITH DIFFERENTIAL/PLATELET
BASOS PCT: 0 % (ref 0–1)
Basophils Absolute: 0 10*3/uL (ref 0.0–0.1)
Eosinophils Absolute: 0.1 10*3/uL (ref 0.0–0.7)
Eosinophils Relative: 1 % (ref 0–5)
HEMATOCRIT: 44.1 % (ref 36.0–46.0)
HEMOGLOBIN: 14.3 g/dL (ref 12.0–15.0)
LYMPHS PCT: 25 % (ref 12–46)
Lymphs Abs: 1.6 10*3/uL (ref 0.7–4.0)
MCH: 28.8 pg (ref 26.0–34.0)
MCHC: 32.4 g/dL (ref 30.0–36.0)
MCV: 88.9 fL (ref 78.0–100.0)
MONO ABS: 0.5 10*3/uL (ref 0.1–1.0)
MONOS PCT: 8 % (ref 3–12)
MPV: 10.2 fL (ref 8.6–12.4)
NEUTROS ABS: 4.1 10*3/uL (ref 1.7–7.7)
Neutrophils Relative %: 66 % (ref 43–77)
PLATELETS: 264 10*3/uL (ref 150–400)
RBC: 4.96 MIL/uL (ref 3.87–5.11)
RDW: 12.9 % (ref 11.5–15.5)
WBC: 6.2 10*3/uL (ref 4.0–10.5)

## 2015-01-27 LAB — COMPREHENSIVE METABOLIC PANEL
ALT: 17 U/L (ref 6–29)
AST: 21 U/L (ref 10–35)
Albumin: 4.2 g/dL (ref 3.6–5.1)
Alkaline Phosphatase: 38 U/L (ref 33–130)
BUN: 20 mg/dL (ref 7–25)
CHLORIDE: 104 mmol/L (ref 98–110)
CO2: 24 mmol/L (ref 20–31)
CREATININE: 0.68 mg/dL (ref 0.50–0.99)
Calcium: 9.1 mg/dL (ref 8.6–10.4)
Glucose, Bld: 98 mg/dL (ref 65–99)
POTASSIUM: 4.2 mmol/L (ref 3.5–5.3)
SODIUM: 139 mmol/L (ref 135–146)
Total Bilirubin: 0.6 mg/dL (ref 0.2–1.2)
Total Protein: 6.6 g/dL (ref 6.1–8.1)

## 2015-01-27 LAB — TSH: TSH: 0.967 u[IU]/mL (ref 0.350–4.500)

## 2015-01-27 LAB — HEMOGLOBIN A1C
Hgb A1c MFr Bld: 5.7 % — ABNORMAL HIGH (ref <5.7)
MEAN PLASMA GLUCOSE: 117 mg/dL — AB (ref <117)

## 2015-01-28 LAB — URINALYSIS W MICROSCOPIC + REFLEX CULTURE
BILIRUBIN URINE: NEGATIVE
CRYSTALS: NONE SEEN [HPF]
Casts: NONE SEEN [LPF]
Glucose, UA: NEGATIVE
Hgb urine dipstick: NEGATIVE
KETONES UR: NEGATIVE
LEUKOCYTES UA: NEGATIVE
Nitrite: NEGATIVE
PROTEIN: NEGATIVE
SQUAMOUS EPITHELIAL / LPF: NONE SEEN [HPF] (ref <=5)
Specific Gravity, Urine: 1.023 (ref 1.001–1.035)
Yeast: NONE SEEN [HPF]
pH: 6 (ref 5.0–8.0)

## 2015-01-28 LAB — VITAMIN D 25 HYDROXY (VIT D DEFICIENCY, FRACTURES): Vit D, 25-Hydroxy: 40 ng/mL (ref 30–100)

## 2015-01-30 ENCOUNTER — Other Ambulatory Visit: Payer: Self-pay | Admitting: Gynecology

## 2015-01-30 DIAGNOSIS — R3129 Other microscopic hematuria: Secondary | ICD-10-CM

## 2015-01-30 LAB — URINE CULTURE

## 2015-01-30 MED ORDER — NITROFURANTOIN MONOHYD MACRO 100 MG PO CAPS: 100.0000 mg | ORAL_CAPSULE | Freq: Two times a day (BID) | ORAL | Status: DC

## 2015-02-21 ENCOUNTER — Encounter: Payer: Self-pay | Admitting: Gynecology

## 2015-02-21 ENCOUNTER — Ambulatory Visit (INDEPENDENT_AMBULATORY_CARE_PROVIDER_SITE_OTHER): Payer: BLUE CROSS/BLUE SHIELD | Admitting: Gynecology

## 2015-02-21 ENCOUNTER — Encounter: Payer: BLUE CROSS/BLUE SHIELD | Admitting: Gynecology

## 2015-02-21 VITALS — BP 124/76 | Ht 62.0 in | Wt 114.0 lb

## 2015-02-21 DIAGNOSIS — R3129 Other microscopic hematuria: Secondary | ICD-10-CM | POA: Diagnosis not present

## 2015-02-21 DIAGNOSIS — M810 Age-related osteoporosis without current pathological fracture: Secondary | ICD-10-CM | POA: Diagnosis not present

## 2015-02-21 DIAGNOSIS — Z01419 Encounter for gynecological examination (general) (routine) without abnormal findings: Secondary | ICD-10-CM

## 2015-02-21 DIAGNOSIS — N952 Postmenopausal atrophic vaginitis: Secondary | ICD-10-CM | POA: Diagnosis not present

## 2015-02-21 LAB — URINALYSIS W MICROSCOPIC + REFLEX CULTURE
Bilirubin Urine: NEGATIVE
CASTS: NONE SEEN [LPF]
CRYSTALS: NONE SEEN [HPF]
Glucose, UA: NEGATIVE
Leukocytes, UA: NEGATIVE
NITRITE: NEGATIVE
Protein, ur: NEGATIVE
SPECIFIC GRAVITY, URINE: 1.02 (ref 1.001–1.035)
YEAST: NONE SEEN [HPF]
pH: 7 (ref 5.0–8.0)

## 2015-02-21 NOTE — Patient Instructions (Signed)

## 2015-02-21 NOTE — Progress Notes (Addendum)
Doris Hanson 1950/07/19 [id number]        65 y.o.  G2P0020  for annual exam.  Several issues noted below.  Past medical history,surgical history, problem list, medications, allergies, family history and social history were all reviewed and documented as reviewed in the EPIC chart.  ROS:  Performed with pertinent positives and negatives included in the history, assessment and plan.   Additional significant findings :  none   Exam: Kennon Portela assistant Filed Vitals:   02/21/15 1559  BP: 124/76  Height:  (1.575 m)  Weight: 114 lb (51.71 kg)   General appearance:  Normal affect, orientation and appearance. Skin: Grossly normal HEENT: Without gross lesions.  No cervical or supraclavicular adenopathy. Thyroid normal.  Lungs:  Clear without wheezing, rales or rhonchi Cardiac: RR, without RMG Abdominal:  Soft, nontender, without masses, guarding, rebound, organomegaly or hernia Breasts:  Examined lying and sitting without masses, retractions, discharge or axillary adenopathy. Pelvic:  Ext/BUS/vagina with atrophic changes  Cervix with atrophic changes  Uterus anteverted, normal size, shape and contour, midline and mobile nontender   Adnexa without masses or tenderness    Anus and perineum normal   Rectovaginal normal sphincter tone without palpated masses or tenderness.    Assessment/Plan:  65 y.o. G57P0020 female for annual exam.   1. Postmenopausal/atrophic genital changes. Patient using estradiol vaginal cream twice weekly and doing well with this. Wants to continue. I again reviewed all issues of absorption are risks to include systemic effects such as stroke heart attack DVT possible breast cancer and endometrial stimulation. Patient is comfortable with this and I refilled her 1 year. Patient knows to report any vaginal bleeding. 2. Osteoporosis. DEXA 06/2013 T score -3.1. Statistically significant increase from last bone density. On Prolia going on four years due in April.  Prior use of Fosamax for 7 years ending in 2013. Was switched to Prolia due to continued decline on Fosamax. Will plan next shot in April and then follow up DEXA in June and then decide if we will choose a drug-free holiday at that time. Increase calcium vitamin D reviewed. Recent vitamin D level 40. She is on extra vitamin D and will continue on this. 3. Pap smear 01/2014. No Pap smear done today. No history of significant abnormal Pap smears previously. 4. Mammography 06/2014. Continue with annual mammography when due. SBE monthly reviewed. 5. Colonoscopy 2011 with reported repeat interval 10 years. 6. Recent blood work shows marginal hemoglobin A1c of 5.7 but normal glucose of 98.  Enterococcus on urinalysis with 3-10 RBCs. Was treated with antibiotics with repeat urinalysis today negative excepting few bacteria but 0-2 RBCs. We'll follow up on culture and treat accordingly.  Cholesterol borderline at 210 noting HDL 93 LDL 100. Follow up in one year, sooner as needed.   Dara Lords MD, 4:39 PM 02/21/2015

## 2015-02-22 LAB — URINE CULTURE: Colony Count: 2000

## 2015-03-15 ENCOUNTER — Telehealth: Payer: Self-pay | Admitting: Gynecology

## 2015-04-05 NOTE — Telephone Encounter (Signed)
Calcium level 9.1 01/27/15 . Insurance benefits  Deductible $2700 ( 2700 met). PA is required . No copay No co insurance. 100 % coverage after deductible met.  Doris Hanson will call and confirm deductible. Injection due after 06/06/2015.

## 2015-05-02 ENCOUNTER — Other Ambulatory Visit: Payer: Self-pay

## 2015-05-02 MED ORDER — NONFORMULARY OR COMPOUNDED ITEM: Status: DC

## 2015-05-09 ENCOUNTER — Encounter: Payer: Self-pay | Admitting: Gynecology

## 2015-05-16 ENCOUNTER — Encounter: Payer: Self-pay | Admitting: Gynecology

## 2015-05-16 NOTE — Telephone Encounter (Signed)
Insurance PA ref # [id number]  05/09/15 thru 05/08/16 . 120 units ( 60 unit dosage). Injection due  After 06/06/15 . Left VM to call and schedule Prolia after 06/06/15

## 2015-05-23 NOTE — Telephone Encounter (Signed)
Prolia injection schedule on 06/07/15 at 3:30 pm Nurse only. Patient cost approx $0 she has met deductible and has no co insurance or co pay.

## 2015-05-30 NOTE — Telephone Encounter (Addendum)
Patient called and will come in on 06/07/15 for Prolia.

## 2015-05-30 NOTE — Telephone Encounter (Deleted)
Prolia schedule

## 2015-06-07 ENCOUNTER — Ambulatory Visit (INDEPENDENT_AMBULATORY_CARE_PROVIDER_SITE_OTHER): Payer: BLUE CROSS/BLUE SHIELD | Admitting: Anesthesiology

## 2015-06-07 DIAGNOSIS — M810 Age-related osteoporosis without current pathological fracture: Secondary | ICD-10-CM

## 2015-06-07 MED ORDER — DENOSUMAB 60 MG/ML ~~LOC~~ SOLN
60.0000 mg | Freq: Once | SUBCUTANEOUS | Status: AC
  Administered 2015-06-07: 60 mg via SUBCUTANEOUS

## 2015-06-12 NOTE — Telephone Encounter (Signed)
Next injection due after 12/08/15

## 2015-07-12 ENCOUNTER — Telehealth: Payer: Self-pay | Admitting: Gynecology

## 2015-07-12 ENCOUNTER — Encounter: Payer: Self-pay | Admitting: Gynecology

## 2015-07-12 NOTE — Telephone Encounter (Signed)
Tell patient her bone density continues to show improvement. Since she is continuing to show improvement I would recommend staying on Prolia one more year, which will make it approximately 5 years next year and then I would discontinue it for a drug-free holiday with follow up density 2 years from now.

## 2015-07-14 NOTE — Telephone Encounter (Signed)
Left message for pt to call.

## 2015-07-19 ENCOUNTER — Encounter: Payer: Self-pay | Admitting: Gynecology

## 2015-07-20 NOTE — Telephone Encounter (Signed)
Left message for pt to call.

## 2015-07-20 NOTE — Telephone Encounter (Signed)
Pt informed with the below note. 

## 2015-11-30 ENCOUNTER — Telehealth: Payer: Self-pay | Admitting: Gynecology

## 2015-12-04 ENCOUNTER — Telehealth: Payer: Self-pay | Admitting: *Deleted

## 2015-12-04 DIAGNOSIS — R7309 Other abnormal glucose: Secondary | ICD-10-CM

## 2015-12-04 DIAGNOSIS — Z1321 Encounter for screening for nutritional disorder: Secondary | ICD-10-CM

## 2015-12-04 DIAGNOSIS — Z1329 Encounter for screening for other suspected endocrine disorder: Secondary | ICD-10-CM

## 2015-12-04 DIAGNOSIS — Z13220 Encounter for screening for lipoid disorders: Secondary | ICD-10-CM

## 2015-12-04 DIAGNOSIS — Z01419 Encounter for gynecological examination (general) (routine) without abnormal findings: Secondary | ICD-10-CM

## 2015-12-04 NOTE — Telephone Encounter (Signed)
Pt has annual scheduled on Feb 1st, would like to have labs done prior to annual exam including Hemoglobin A1C. Please advise

## 2015-12-04 NOTE — Telephone Encounter (Signed)
Orders placed le ft on pt voicemail to call and schedule lab appointment.

## 2015-12-04 NOTE — Telephone Encounter (Signed)
Okay for CBC, comprehensive metabolic panel, lipid profile, TSH, vitamin D, hemoglobin A1c, urinalysis

## 2015-12-05 NOTE — Telephone Encounter (Signed)
Prolia due after 12/08/15  Calcium 9.1  01/27/15 . Deductible 2700 (met), No copay , No co insurance. Pa needed ref # [id number] valid 05/09/15 thru 05/09/15  Confirmed by [place]  Appointment made 12/11/15

## 2015-12-07 ENCOUNTER — Encounter: Payer: Self-pay | Admitting: Gynecology

## 2015-12-11 ENCOUNTER — Ambulatory Visit: Payer: BLUE CROSS/BLUE SHIELD

## 2015-12-13 NOTE — Telephone Encounter (Addendum)
No show for Prolia 12/11/15  Rescheduled on 12/18/15 . Complete exam 02/21/2015 with TF

## 2015-12-18 ENCOUNTER — Ambulatory Visit (INDEPENDENT_AMBULATORY_CARE_PROVIDER_SITE_OTHER): Payer: BLUE CROSS/BLUE SHIELD | Admitting: Gynecology

## 2015-12-18 DIAGNOSIS — M810 Age-related osteoporosis without current pathological fracture: Secondary | ICD-10-CM

## 2015-12-18 MED ORDER — DENOSUMAB 60 MG/ML ~~LOC~~ SOLN
60.0000 mg | Freq: Once | SUBCUTANEOUS | Status: AC
  Administered 2015-12-18: 60 mg via SUBCUTANEOUS

## 2015-12-19 NOTE — Telephone Encounter (Signed)
prolia given 12/18/15  Next injection due after 06/17/16

## 2016-02-15 ENCOUNTER — Other Ambulatory Visit: Payer: BLUE CROSS/BLUE SHIELD

## 2016-02-15 DIAGNOSIS — R7309 Other abnormal glucose: Secondary | ICD-10-CM

## 2016-02-15 DIAGNOSIS — Z1321 Encounter for screening for nutritional disorder: Secondary | ICD-10-CM

## 2016-02-15 DIAGNOSIS — Z01419 Encounter for gynecological examination (general) (routine) without abnormal findings: Secondary | ICD-10-CM

## 2016-02-15 DIAGNOSIS — Z1329 Encounter for screening for other suspected endocrine disorder: Secondary | ICD-10-CM

## 2016-02-15 DIAGNOSIS — Z13220 Encounter for screening for lipoid disorders: Secondary | ICD-10-CM

## 2016-02-15 LAB — CBC WITH DIFFERENTIAL/PLATELET
Basophils Absolute: 47 cells/uL (ref 0–200)
Basophils Relative: 1 %
EOS PCT: 1 %
Eosinophils Absolute: 47 cells/uL (ref 15–500)
HCT: 43.2 % (ref 35.0–45.0)
HEMOGLOBIN: 14.2 g/dL (ref 11.7–15.5)
LYMPHS ABS: 1457 {cells}/uL (ref 850–3900)
LYMPHS PCT: 31 %
MCH: 29.3 pg (ref 27.0–33.0)
MCHC: 32.9 g/dL (ref 32.0–36.0)
MCV: 89.3 fL (ref 80.0–100.0)
MPV: 10 fL (ref 7.5–12.5)
Monocytes Absolute: 329 cells/uL (ref 200–950)
Monocytes Relative: 7 %
NEUTROS PCT: 60 %
Neutro Abs: 2820 cells/uL (ref 1500–7800)
Platelets: 272 10*3/uL (ref 140–400)
RBC: 4.84 MIL/uL (ref 3.80–5.10)
RDW: 13 % (ref 11.0–15.0)
WBC: 4.7 10*3/uL (ref 3.8–10.8)

## 2016-02-15 LAB — COMPREHENSIVE METABOLIC PANEL
ALBUMIN: 3.8 g/dL (ref 3.6–5.1)
ALT: 14 U/L (ref 6–29)
AST: 18 U/L (ref 10–35)
Alkaline Phosphatase: 38 U/L (ref 33–130)
BUN: 18 mg/dL (ref 7–25)
CALCIUM: 9.1 mg/dL (ref 8.6–10.4)
CHLORIDE: 103 mmol/L (ref 98–110)
CO2: 27 mmol/L (ref 20–31)
CREATININE: 0.65 mg/dL (ref 0.50–0.99)
Glucose, Bld: 101 mg/dL — ABNORMAL HIGH (ref 65–99)
Potassium: 4.6 mmol/L (ref 3.5–5.3)
SODIUM: 139 mmol/L (ref 135–146)
Total Bilirubin: 0.6 mg/dL (ref 0.2–1.2)
Total Protein: 6.6 g/dL (ref 6.1–8.1)

## 2016-02-15 LAB — URINALYSIS W MICROSCOPIC + REFLEX CULTURE
Bacteria, UA: NONE SEEN [HPF]
Bilirubin Urine: NEGATIVE
CASTS: NONE SEEN [LPF]
Crystals: NONE SEEN [HPF]
Glucose, UA: NEGATIVE
HGB URINE DIPSTICK: NEGATIVE
Ketones, ur: NEGATIVE
LEUKOCYTES UA: NEGATIVE
Nitrite: NEGATIVE
Protein, ur: NEGATIVE
Specific Gravity, Urine: 1.016 (ref 1.001–1.035)
WBC, UA: NONE SEEN WBC/HPF (ref <=5)
YEAST: NONE SEEN [HPF]
pH: 7 (ref 5.0–8.0)

## 2016-02-15 LAB — LIPID PANEL
Cholesterol: 201 mg/dL — ABNORMAL HIGH (ref <200)
HDL: 85 mg/dL (ref >50)
LDL CALC: 103 mg/dL — AB (ref <100)
TRIGLYCERIDES: 63 mg/dL (ref <150)
Total CHOL/HDL Ratio: 2.4 Ratio (ref <5.0)
VLDL: 13 mg/dL (ref <30)

## 2016-02-15 LAB — TSH: TSH: 1.27 m[IU]/L

## 2016-02-16 ENCOUNTER — Encounter: Payer: Self-pay | Admitting: Gynecology

## 2016-02-16 LAB — HEMOGLOBIN A1C
HEMOGLOBIN A1C: 5.3 % (ref <5.7)
Mean Plasma Glucose: 105 mg/dL

## 2016-02-16 LAB — VITAMIN D 25 HYDROXY (VIT D DEFICIENCY, FRACTURES): Vit D, 25-Hydroxy: 45 ng/mL (ref 30–100)

## 2016-02-17 LAB — URINE CULTURE

## 2016-02-22 ENCOUNTER — Ambulatory Visit (INDEPENDENT_AMBULATORY_CARE_PROVIDER_SITE_OTHER): Payer: BLUE CROSS/BLUE SHIELD | Admitting: Gynecology

## 2016-02-22 ENCOUNTER — Encounter: Payer: Self-pay | Admitting: Gynecology

## 2016-02-22 VITALS — BP 118/74 | Ht 62.0 in | Wt 112.0 lb

## 2016-02-22 DIAGNOSIS — N952 Postmenopausal atrophic vaginitis: Secondary | ICD-10-CM | POA: Diagnosis not present

## 2016-02-22 DIAGNOSIS — M810 Age-related osteoporosis without current pathological fracture: Secondary | ICD-10-CM | POA: Diagnosis not present

## 2016-02-22 DIAGNOSIS — Z01411 Encounter for gynecological examination (general) (routine) with abnormal findings: Secondary | ICD-10-CM

## 2016-02-22 NOTE — Progress Notes (Signed)
    Doris NeerLynn Green Bomar 08/12/1950 [id number]        66 y.o.  G2P0020 for annual exam.    Past medical history,surgical history, problem list, medications, allergies, family history and social history were all reviewed and documented as reviewed in the EPIC chart.  ROS:  Performed with pertinent positives and negatives included in the history, assessment and plan.   Additional significant findings :  None   Exam: Kennon PortelaKim Deridder assistant Vitals:   02/22/16 1531  BP: 118/74  Weight: 112 lb (50.8 kg)  Height: 5\' 2"  (1.575 m)   Body mass index is 20.49 kg/m.  General appearance:  Normal affect, orientation and appearance. Skin: Grossly normal HEENT: Without gross lesions.  No cervical or supraclavicular adenopathy. Thyroid normal.  Lungs:  Clear without wheezing, rales or rhonchi Cardiac: RR, without RMG Abdominal:  Soft, nontender, without masses, guarding, rebound, organomegaly or hernia Breasts:  Examined lying and sitting without masses, retractions, discharge or axillary adenopathy. Pelvic:  Ext, BUS, Vagina with atrophic changes  Cervix with atrophic changes  Uterus anteverted, normal size, shape and contour, midline and mobile nontender   Adnexa without masses or tenderness    Anus and perineum normal   Rectovaginal normal sphincter tone without palpated masses or tenderness.    Assessment/Plan:  66 y.o. 512P0020 female for annual exam.   1. Postmenopausal/atrophic genital changes.  Using vaginal estrogen twice weekly doing well with this. Has supply at home will call when she needs more. We have discussed the issues of risk to include absorption with systemic effects such as stroke heart attack DVT breast cancer endometrial stimulation. 2. Osteoporosis. DEXA 06/2015 T score -2.9 statistically improved from prior DEXA. Approaching 5 years on Prolia now. Last shot reported November. Will hold further Prolia and repeat bone density next year. Recent vitamin D 45. 3. Pap smear  01/2014. No Pap smear done today. No history of significant abnormal Pap smears. Plan repeat Pap smear next year at 3 year interval per current screening guidelines. 4. Colonoscopy 2011 with reported repeat interval 10 years. 5. Mammography 06/2015. Continue with annual mammography when due. SBE monthly reviewed. 6. Health maintenance. Recent lab work all looks good with cholesterol 201, LDL 103 but HDL 85. Glucose 101 hemoglobin A1c 5.3. TSH normal. Comprehensive metabolic panel normal. Hemoglobin 14. Follow up in one year, sooner as needed.   Doris LordsFONTAINE,Doris Raborn P MD, 3:57 PM 02/22/2016

## 2016-02-22 NOTE — Patient Instructions (Signed)

## 2016-03-15 ENCOUNTER — Telehealth: Payer: Self-pay | Admitting: *Deleted

## 2016-03-15 MED ORDER — NONFORMULARY OR COMPOUNDED ITEM: 3 refills | Status: DC

## 2016-03-15 NOTE — Telephone Encounter (Signed)
Pt called requesting refill on estrogen vaginal 0.02% cream, Rx called in.

## 2016-06-04 ENCOUNTER — Telehealth: Payer: Self-pay | Admitting: Gynecology

## 2016-06-05 ENCOUNTER — Encounter: Payer: Self-pay | Admitting: Gynecology

## 2016-06-11 NOTE — Telephone Encounter (Signed)
Prolia due

## 2016-06-11 NOTE — Telephone Encounter (Signed)
Prolia due 06/17/16, PA needed  Ref # [id number]  . Deductible $2700 ($70 met). No co pay , No [employer]. Calcium 9.1  02/15/16  Complete TF 02/22/16. Patient will need Prolia co pay card also. Notes faxed to [facility]. Per OV note from Dr Loetta Rough hold on Prolia with bone density 2019 pc to pt left VM

## 2016-06-12 NOTE — Telephone Encounter (Signed)
PC from pt per Dr Audie BoxFontaine Prolia drug holiday . I will verify with Dr Audie BoxFontaine.

## 2016-06-14 NOTE — Telephone Encounter (Signed)
With new data about accelerated bone loss after stopping Prolia, I'd recommend continuing Prolia for another year and follow up bone density 2019.

## 2016-06-18 NOTE — Telephone Encounter (Signed)
Pc to pt . She will renew Prolia benefit card. [employer] allowable [id number] 1079.40  96372 $ 51.42  Total allowable 1130.82  . Deductible pt $2700 (70 met ) total $2630/ Prolia benefit card $1500.  Marland Kitchen Total deductible left for pt after Prolia card benefit $1499.18 with 369.18 left on Prolia card. Approx cost for 2nd Prolia injection $1130 for pt. I explained and she understands and accepts cost for second injection. Will send in information for PA

## 2016-06-18 NOTE — Telephone Encounter (Signed)
Called pt left VM to call regarding continue Prolia injection.

## 2016-06-25 NOTE — Telephone Encounter (Signed)
PC from pt she has received new Prolia benefits card. Waiting on PA , insurance denied due to no records, records have been sent. PC to pt explained will wait for PA to schedule Injection.

## 2016-06-26 NOTE — Telephone Encounter (Addendum)
PA approval ref# [id number]  Valid 06/17/16 thru 06/17/17 . She will come in 06/27/16 at 3 pm for injection, next injection due 12/28/16

## 2016-06-27 ENCOUNTER — Ambulatory Visit (INDEPENDENT_AMBULATORY_CARE_PROVIDER_SITE_OTHER): Payer: BLUE CROSS/BLUE SHIELD | Admitting: Gynecology

## 2016-06-27 ENCOUNTER — Encounter: Payer: Self-pay | Admitting: Gynecology

## 2016-06-27 DIAGNOSIS — M810 Age-related osteoporosis without current pathological fracture: Secondary | ICD-10-CM

## 2016-06-27 MED ORDER — DENOSUMAB 60 MG/ML ~~LOC~~ SOLN
60.0000 mg | Freq: Once | SUBCUTANEOUS | Status: AC
  Administered 2016-06-27: 60 mg via SUBCUTANEOUS

## 2016-06-28 ENCOUNTER — Encounter: Payer: Self-pay | Admitting: Gynecology

## 2016-07-15 ENCOUNTER — Encounter: Payer: Self-pay | Admitting: Gynecology

## 2016-08-29 ENCOUNTER — Encounter: Payer: Self-pay | Admitting: Gynecology

## 2016-12-23 ENCOUNTER — Telehealth: Payer: Self-pay | Admitting: Gynecology

## 2016-12-25 NOTE — Telephone Encounter (Signed)
Prolia due 12/28/16  Calcium 9.1 01/2016  Complete TF 02/2016 Deductuble 2700 ($1226 met), $1474.00  No co pay ,No co insurance, PA needed ref# [id number] valid 06/17/16 thru 06/17/2017  Allowable BCBS    96372  51.72   [id number] 1079.40 total allowable $1130.82.Marland Kitchen Prolia card applied to balance in 2018 with est balance left on Prolia card of $369.18 approx cost to pt 761.64

## 2016-12-26 NOTE — Telephone Encounter (Addendum)
Talked with Larita FifeLynn she understands cost of Prolia approx $761.64  Prolia card balance $369.18, She will bring in card copy to Sherrilyn RistKari and she will also call to schedule Prolia injection the morning of the day she comes in. She will be billed after payment received from Prolia for her balance(has HSA account) She understands [facility]. Financial agreement Prolia card signed.

## 2016-12-27 ENCOUNTER — Encounter: Payer: Self-pay | Admitting: Gynecology

## 2017-01-02 ENCOUNTER — Ambulatory Visit (INDEPENDENT_AMBULATORY_CARE_PROVIDER_SITE_OTHER): Payer: BLUE CROSS/BLUE SHIELD | Admitting: Gynecology

## 2017-01-02 DIAGNOSIS — M810 Age-related osteoporosis without current pathological fracture: Secondary | ICD-10-CM | POA: Diagnosis not present

## 2017-01-02 MED ORDER — DENOSUMAB 60 MG/ML ~~LOC~~ SOLN
60.0000 mg | Freq: Once | SUBCUTANEOUS | Status: AC
  Administered 2017-01-02: 60 mg via SUBCUTANEOUS

## 2017-01-02 NOTE — Telephone Encounter (Signed)
Prolia scheduled for 01/02/17

## 2017-01-06 ENCOUNTER — Encounter: Payer: Self-pay | Admitting: Obstetrics & Gynecology

## 2017-01-07 NOTE — Telephone Encounter (Signed)
Prolia Given 01/02/17  Next injection 07/04/17

## 2017-03-26 ENCOUNTER — Telehealth: Payer: Self-pay | Admitting: *Deleted

## 2017-03-26 DIAGNOSIS — Z01419 Encounter for gynecological examination (general) (routine) without abnormal findings: Secondary | ICD-10-CM

## 2017-03-26 DIAGNOSIS — Z13220 Encounter for screening for lipoid disorders: Secondary | ICD-10-CM

## 2017-03-26 NOTE — Telephone Encounter (Signed)
Patient called has annual scheduled on 05/05/17,would like to have labs done prior to annual exam. Please advise

## 2017-03-26 NOTE — Telephone Encounter (Signed)
Okay for CBC, comprehensive metabolic panel and lipid profile.  Tell [facility] that [facility] does not compensate for vitamin D level and TSH unless some disease diagnoses.  Both of these were normal last year and I do not think they need to be repeated this year.

## 2017-03-27 NOTE — Telephone Encounter (Signed)
Left detailed message on voicemail, orders placed.

## 2017-04-24 ENCOUNTER — Other Ambulatory Visit: Payer: BLUE CROSS/BLUE SHIELD

## 2017-04-24 DIAGNOSIS — Z01419 Encounter for gynecological examination (general) (routine) without abnormal findings: Secondary | ICD-10-CM

## 2017-04-24 DIAGNOSIS — Z13220 Encounter for screening for lipoid disorders: Secondary | ICD-10-CM

## 2017-04-24 LAB — LIPID PANEL
Cholesterol: 229 mg/dL — ABNORMAL HIGH (ref <200)
HDL: 80 mg/dL (ref >50)
LDL Cholesterol (Calc): 131 mg/dL (calc) — ABNORMAL HIGH
Non-HDL Cholesterol (Calc): 149 mg/dL (calc) — ABNORMAL HIGH (ref <130)
Total CHOL/HDL Ratio: 2.9 (calc) (ref <5.0)
Triglycerides: 85 mg/dL (ref <150)

## 2017-04-24 LAB — COMPREHENSIVE METABOLIC PANEL
AG RATIO: 1.7 (calc) (ref 1.0–2.5)
ALBUMIN MSPROF: 4.3 g/dL (ref 3.6–5.1)
ALT: 18 U/L (ref 6–29)
AST: 22 U/L (ref 10–35)
Alkaline phosphatase (APISO): 45 U/L (ref 33–130)
BUN: 14 mg/dL (ref 7–25)
CHLORIDE: 105 mmol/L (ref 98–110)
CO2: 30 mmol/L (ref 20–32)
Calcium: 9.4 mg/dL (ref 8.6–10.4)
Creat: 0.63 mg/dL (ref 0.50–0.99)
GLOBULIN: 2.6 g/dL (ref 1.9–3.7)
GLUCOSE: 95 mg/dL (ref 65–99)
POTASSIUM: 4.6 mmol/L (ref 3.5–5.3)
SODIUM: 143 mmol/L (ref 135–146)
TOTAL PROTEIN: 6.9 g/dL (ref 6.1–8.1)
Total Bilirubin: 0.7 mg/dL (ref 0.2–1.2)

## 2017-04-24 LAB — CBC
HCT: 43.4 % (ref 35.0–45.0)
Hemoglobin: 14.4 g/dL (ref 11.7–15.5)
MCH: 28.5 pg (ref 27.0–33.0)
MCHC: 33.2 g/dL (ref 32.0–36.0)
MCV: 85.9 fL (ref 80.0–100.0)
MPV: 10.3 fL (ref 7.5–12.5)
PLATELETS: 279 10*3/uL (ref 140–400)
RBC: 5.05 10*6/uL (ref 3.80–5.10)
RDW: 12.1 % (ref 11.0–15.0)
WBC: 7 10*3/uL (ref 3.8–10.8)

## 2017-04-25 ENCOUNTER — Encounter: Payer: Self-pay | Admitting: Gynecology

## 2017-05-05 ENCOUNTER — Encounter: Payer: Self-pay | Admitting: Gynecology

## 2017-05-05 ENCOUNTER — Ambulatory Visit (INDEPENDENT_AMBULATORY_CARE_PROVIDER_SITE_OTHER): Payer: BLUE CROSS/BLUE SHIELD | Admitting: Gynecology

## 2017-05-05 VITALS — BP 118/74 | Ht 62.0 in | Wt 115.0 lb

## 2017-05-05 DIAGNOSIS — M810 Age-related osteoporosis without current pathological fracture: Secondary | ICD-10-CM

## 2017-05-05 DIAGNOSIS — Z01411 Encounter for gynecological examination (general) (routine) with abnormal findings: Secondary | ICD-10-CM

## 2017-05-05 DIAGNOSIS — N952 Postmenopausal atrophic vaginitis: Secondary | ICD-10-CM

## 2017-05-05 NOTE — Progress Notes (Signed)
    Doris NeerLynn Green Hanson 05/15/1950 [id number]        67 y.o.  G2P0020 for annual gynecologic exam.  Doing well without gynecologic complaints.  Past medical history,surgical history, problem list, medications, allergies, family history and social history were all reviewed and documented as reviewed in the EPIC chart.  ROS:  Performed with pertinent positives and negatives included in the history, assessment and plan.   Additional significant findings : None   Exam: Kennon PortelaKim Kijowski assistant Vitals:   05/05/17 1616  BP: 118/74  Weight: 115 lb (52.2 kg)  Height: 5\' 2"  (1.575 m)   Body mass index is 21.03 kg/m.  General appearance:  Normal affect, orientation and appearance. Skin: Grossly normal HEENT: Without gross lesions.  No cervical or supraclavicular adenopathy. Thyroid normal.  Lungs:  Clear without wheezing, rales or rhonchi Cardiac: RR, without RMG Abdominal:  Soft, nontender, without masses, guarding, rebound, organomegaly or hernia Breasts:  Examined lying and sitting without masses, retractions, discharge or axillary adenopathy. Pelvic:  Ext, BUS, Vagina: With atrophic changes  Cervix: With atrophic changes.  Pap smear done  Uterus: Anteverted, normal size, shape and contour, midline and mobile nontender   Adnexa: Without masses or tenderness    Anus and perineum: Normal   Rectovaginal: Normal sphincter tone without palpated masses or tenderness.    Assessment/Plan:  67 y.o. 842P0020 female for annual gynecologic exam.   1. Postmenopausal/atrophic genital changes.  Doing well without significant symptoms or any vaginal bleeding.  Had been on vaginal estradiol but discontinued. 2. Pap smear 2016.  Pap smear done today.  No history of significant abnormal Pap smears previously. 3. Mammography 06/2016.  Continue with annual mammography this coming June/July.  Breast exam normal today. 4. Osteoporosis.  DEXA 2017 T score -2.9 statistically improved since prior DEXA.  5 years  use of Prolia.  Had been on bisphosphate's previously but switched over due to length of time on the bisphosphate's by her history.  Discussed issues of continuing versus stopping or switching to bisphosphate's.  Issues of accelerated bone loss after stopping Prolia was discussed.  At this point my recommendation would be to continue on Prolia.  She is coming due for her injection in June and will follow-up for this.  We will go ahead and repeat her bone density this summer at a 2-year interval and she is going to arrange through [facility]. 5. Colonoscopy 2011.  Repeat at their recommended interval. 6. Health maintenance.  Recent did her lab work before coming today.  We did not discuss this.  My staff will call her and relay her normal lab work with the exception of her cholesterol at 229 and her LDL 131.  Her HDL was 80.  I am going to ask her to show these numbers to her primary physician to make sure she is okay with them but given the good HDL I suspect monitoring will be the plan.  Follow-up for bone density and Prolia follow-up in 1 year for annual exam.   Dara Lordsimothy P Catlin Doria MD, 4:47 PM 05/05/2017

## 2017-05-05 NOTE — Patient Instructions (Addendum)
Follow-up for your bone density this coming summer.  Follow-up for your Prolia shot this coming June.

## 2017-05-05 NOTE — Addendum Note (Signed)
Addended by: Dayna BarkerGARDNER, Dawnell Bryant K on: 05/05/2017 05:03 PM   Modules accepted: Orders

## 2017-05-06 ENCOUNTER — Telehealth: Payer: Self-pay | Admitting: *Deleted

## 2017-05-06 LAB — PAP IG W/ RFLX HPV ASCU

## 2017-05-06 NOTE — Telephone Encounter (Signed)
Patient said she will call me back she is leaving to attend a meeting.

## 2017-05-06 NOTE — Telephone Encounter (Signed)
Pt informed with the below note, pt said she is not using the estradiol vaginal cream. And will follow up with pcp regarding labs.

## 2017-05-06 NOTE — Telephone Encounter (Signed)
-----   Message from Dara Lordsimothy P Fontaine, MD sent at 05/05/2017  4:56 PM EDT ----- Follow-up with patient reference her recent annual exam. 1.  I just wanted to make sure she is not continuing the estradiol vaginal cream 2.  I did not realize that she had her labs done before coming to the office for her visit.  I did not review them with her at her office visit.  Her labs all look good with the exception of her cholesterol borderline at 229 and her LDL at 131.  Her HDL continues to look good.  I would recommend letting her primary physician see these values which she can download from MyChart to make sure that he does not want to treat these with medication.

## 2017-06-05 ENCOUNTER — Telehealth: Payer: Self-pay | Admitting: *Deleted

## 2017-06-05 NOTE — Telephone Encounter (Signed)
Prolia insurance verification has been sent awaiting Summary of benefits  

## 2017-06-10 NOTE — Telephone Encounter (Signed)
Received message from Prolia stating [employer] was terminated   Called pt she states she on Medicare now. Pt will bring new card in and we will scan in system

## 2017-06-17 NOTE — Telephone Encounter (Addendum)
Deductible  (Amount Met)  OOP MAX Individual: $4200 ($0 met)  Annual  05/05/17  Calcium 9.4     Date 4/419  Upcoming dental procedures   Prior Authorization needed YES (approved from 07/03/17 to 07/04/2018)  Pt estimated Cost $238  Appt 07/11/17 _0 :00

## 2017-06-19 ENCOUNTER — Encounter: Payer: Self-pay | Admitting: Gynecology

## 2017-06-19 DIAGNOSIS — R69 Illness, unspecified: Secondary | ICD-10-CM | POA: Diagnosis not present

## 2017-07-11 ENCOUNTER — Ambulatory Visit (INDEPENDENT_AMBULATORY_CARE_PROVIDER_SITE_OTHER): Payer: Medicare HMO | Admitting: Gynecology

## 2017-07-11 DIAGNOSIS — M810 Age-related osteoporosis without current pathological fracture: Secondary | ICD-10-CM

## 2017-07-11 MED ORDER — DENOSUMAB 60 MG/ML ~~LOC~~ SOSY
60.0000 mg | PREFILLED_SYRINGE | Freq: Once | SUBCUTANEOUS | Status: AC
  Administered 2017-07-11: 60 mg via SUBCUTANEOUS

## 2017-07-16 ENCOUNTER — Encounter: Payer: Self-pay | Admitting: Gynecology

## 2017-07-16 DIAGNOSIS — M810 Age-related osteoporosis without current pathological fracture: Secondary | ICD-10-CM | POA: Diagnosis not present

## 2017-07-16 DIAGNOSIS — Z1231 Encounter for screening mammogram for malignant neoplasm of breast: Secondary | ICD-10-CM | POA: Diagnosis not present

## 2017-07-17 ENCOUNTER — Encounter: Payer: Self-pay | Admitting: Gynecology

## 2017-07-18 NOTE — Telephone Encounter (Signed)
Prolia Given 07/11/17 Next injection 01/11/18

## 2017-07-21 ENCOUNTER — Encounter: Payer: Self-pay | Admitting: Gynecology

## 2017-08-13 ENCOUNTER — Telehealth: Payer: Self-pay

## 2017-08-13 NOTE — Telephone Encounter (Signed)
Patient called asking me to deactivate her My Chart account. She denied signing up for it and does not want results sent that way.  At her request I deactivated her account.

## 2017-10-07 DIAGNOSIS — H25013 Cortical age-related cataract, bilateral: Secondary | ICD-10-CM | POA: Diagnosis not present

## 2017-10-07 DIAGNOSIS — H2513 Age-related nuclear cataract, bilateral: Secondary | ICD-10-CM | POA: Diagnosis not present

## 2017-10-07 DIAGNOSIS — E119 Type 2 diabetes mellitus without complications: Secondary | ICD-10-CM | POA: Diagnosis not present

## 2017-10-07 DIAGNOSIS — H524 Presbyopia: Secondary | ICD-10-CM | POA: Diagnosis not present

## 2017-10-07 DIAGNOSIS — H40013 Open angle with borderline findings, low risk, bilateral: Secondary | ICD-10-CM | POA: Diagnosis not present

## 2017-10-15 ENCOUNTER — Telehealth: Payer: Self-pay | Admitting: *Deleted

## 2017-10-15 DIAGNOSIS — M810 Age-related osteoporosis without current pathological fracture: Secondary | ICD-10-CM | POA: Diagnosis not present

## 2017-10-15 DIAGNOSIS — Z6822 Body mass index (BMI) 22.0-22.9, adult: Secondary | ICD-10-CM | POA: Diagnosis not present

## 2017-10-15 NOTE — Telephone Encounter (Signed)
Darl Pikes called from Dr. Hyacinth Meeker office asking for lipid profile copies, she had 2019 result, was requesting 2018 results. Copy faxed to her attention to [phone].

## 2017-10-27 DIAGNOSIS — R69 Illness, unspecified: Secondary | ICD-10-CM | POA: Diagnosis not present

## 2017-11-10 ENCOUNTER — Telehealth: Payer: Self-pay | Admitting: *Deleted

## 2017-11-10 NOTE — Telephone Encounter (Addendum)
Deductible AMOUNT MET  OOP MAX $4200 ($279MET)  Annual exam 05/05/17 TF  Calcium 9.4            Date 04/24/17  Upcoming dental procedures NO  Prior Authorization needed  YES but valid REFERENCE # stating authorization valid  #[id number]   Pt estimated Cost $238  appt 01/12/18 at 3:30       Coverage details: 20% of one dose and $25 admin fee

## 2017-11-14 NOTE — Telephone Encounter (Signed)
Pt came in today asking about Prolia   I explained cost and when she was due  Pt states she prefers a call 1 week before her due date to scheduled Prolia  Will call pt the week of December 16th

## 2017-12-01 DIAGNOSIS — R69 Illness, unspecified: Secondary | ICD-10-CM | POA: Diagnosis not present

## 2017-12-09 NOTE — Telephone Encounter (Signed)
Jerilynn MagesShaffer, Claudia sent to Daryll BrodBonham, Tandre Conly, RMA        Patient ready to schedule prolia. Let me know when to schedule it for.    Staff message sent 12/08/17  I called pt and left a message to return my call

## 2018-01-12 ENCOUNTER — Ambulatory Visit (INDEPENDENT_AMBULATORY_CARE_PROVIDER_SITE_OTHER): Payer: Medicare HMO | Admitting: *Deleted

## 2018-01-12 DIAGNOSIS — M810 Age-related osteoporosis without current pathological fracture: Secondary | ICD-10-CM

## 2018-01-12 MED ORDER — DENOSUMAB 60 MG/ML ~~LOC~~ SOSY
60.0000 mg | PREFILLED_SYRINGE | Freq: Once | SUBCUTANEOUS | Status: AC
Start: 2018-01-12 — End: 2018-01-12
  Administered 2018-01-12: 60 mg via SUBCUTANEOUS

## 2018-01-23 NOTE — Telephone Encounter (Signed)
PROLIA GIVEN 01/12/18 NEXT INJECTION 07/15/2018 

## 2018-02-03 ENCOUNTER — Encounter: Payer: Self-pay | Admitting: Gynecology

## 2018-02-27 ENCOUNTER — Telehealth: Payer: Self-pay

## 2018-02-27 NOTE — Telephone Encounter (Signed)
As she is on Medicare her primary provider needs to be doing her routine blood work.  They consider OB/GYN's a specialty and will not pay for them to do routine blood work.

## 2018-02-27 NOTE — Telephone Encounter (Signed)
CE is scheduled for 04/29/18. She would like to come the week before to have labs done so she can review them with you at visit.  Please advise what you would like to order.

## 2018-02-27 NOTE — Telephone Encounter (Signed)
Patient advised. She said she will call and make appt with her PCP.

## 2018-03-10 DIAGNOSIS — R69 Illness, unspecified: Secondary | ICD-10-CM | POA: Diagnosis not present

## 2018-03-25 DIAGNOSIS — M1990 Unspecified osteoarthritis, unspecified site: Secondary | ICD-10-CM | POA: Diagnosis not present

## 2018-03-25 DIAGNOSIS — E669 Obesity, unspecified: Secondary | ICD-10-CM | POA: Diagnosis not present

## 2018-03-25 DIAGNOSIS — R69 Illness, unspecified: Secondary | ICD-10-CM | POA: Diagnosis not present

## 2018-04-15 DIAGNOSIS — M1990 Unspecified osteoarthritis, unspecified site: Secondary | ICD-10-CM | POA: Diagnosis not present

## 2018-04-15 DIAGNOSIS — R69 Illness, unspecified: Secondary | ICD-10-CM | POA: Diagnosis not present

## 2018-04-15 DIAGNOSIS — E669 Obesity, unspecified: Secondary | ICD-10-CM | POA: Diagnosis not present

## 2018-04-27 ENCOUNTER — Other Ambulatory Visit: Payer: Self-pay

## 2018-04-29 ENCOUNTER — Encounter: Payer: Self-pay | Admitting: Gynecology

## 2018-04-29 ENCOUNTER — Ambulatory Visit: Payer: Medicare HMO | Admitting: Gynecology

## 2018-04-29 ENCOUNTER — Other Ambulatory Visit: Payer: Self-pay

## 2018-04-29 VITALS — BP 124/70 | Ht 62.0 in | Wt 114.0 lb

## 2018-04-29 DIAGNOSIS — Z01419 Encounter for gynecological examination (general) (routine) without abnormal findings: Secondary | ICD-10-CM

## 2018-04-29 DIAGNOSIS — M810 Age-related osteoporosis without current pathological fracture: Secondary | ICD-10-CM

## 2018-04-29 DIAGNOSIS — N952 Postmenopausal atrophic vaginitis: Secondary | ICD-10-CM

## 2018-04-29 NOTE — Patient Instructions (Signed)
Follow-up in June when due for your next Prolia shot.  Follow-up in 1 year for annual exam.

## 2018-04-29 NOTE — Progress Notes (Signed)
    Caryol Shane 01/21/1951 [id number]        68 y.o.  G2P0020 for breast and pelvic exam  Past medical history,surgical history, problem list, medications, allergies, family history and social history were all reviewed and documented as reviewed in the EPIC chart.  ROS:  Performed with pertinent positives and negatives included in the history, assessment and plan.   Additional significant findings : None   Exam: Kennon Portela assistant Vitals:   04/29/18 0950  BP: 124/70  Weight: 114 lb (51.7 kg)  Height: 5\' 2"  (1.575 m)   Body mass index is 20.85 kg/m.  General appearance:  Normal affect, orientation and appearance. Skin: Grossly normal HEENT: Without gross lesions.  No cervical or supraclavicular adenopathy. Thyroid normal.  Lungs:  Clear without wheezing, rales or rhonchi Cardiac: RR, without RMG Abdominal:  Soft, nontender, without masses, guarding, rebound, organomegaly or hernia Breasts:  Examined lying and sitting without masses, retractions, discharge or axillary adenopathy. Pelvic:  Ext, BUS, Vagina: With atrophic changes  Cervix: With atrophic changes  Uterus: Anteverted, normal size, shape and contour, midline and mobile nontender   Adnexa: Without masses or tenderness    Anus and perineum: Normal   Rectovaginal: Normal sphincter tone without palpated masses or tenderness.    Assessment/Plan:  68 y.o. G28P0020 female for annual gynecologic exam.   1. Postmenopausal.  No significant menopausal symptoms.  No vaginal bleeding. 2. Pap smear 2019.  No Pap smear done today.  No history of significant abnormal Pap smears.  Options to stop screening versus less frequent screening intervals reviewed.  Will readdress on an annual basis. 3. Mammography 06/2017.  Continue with annual mammography when due.  Breast exam normal today. 4. Postmenopausal osteoporosis.  DEXA 2019 T score -2.6.  Continues on Prolia for approximately 6 years.  We will continue for now and repeat  DEXA next year at 2-year interval.  She will follow-up in June when due for her next shot. 5. Colonoscopy 2011.  Reminded patient she will be due next year. 6. Health maintenance.  No routine lab work done as patient plans to do this at her primary physician's office.  Follow-up for Prolia in June.  Follow-up for annual exam in 1 year   Dara Lords MD, 10:16 AM 04/29/2018

## 2018-06-11 ENCOUNTER — Telehealth: Payer: Self-pay | Admitting: *Deleted

## 2018-06-11 DIAGNOSIS — M810 Age-related osteoporosis without current pathological fracture: Secondary | ICD-10-CM

## 2018-06-11 NOTE — Telephone Encounter (Addendum)
Deductible Amount met  OOP MAX $3600 ($32mt)  Annual exam 04/29/2018 TF  Calcium 9.4            Date  07/08/2018  Upcoming dental procedures NO  Prior Authorization needed Yes working on it  Pt estimated Cost $222  Appt July 14th @ 10:00      Coverage Details:20% one dose ,20% admin fee

## 2018-07-08 ENCOUNTER — Other Ambulatory Visit: Payer: Self-pay

## 2018-07-08 ENCOUNTER — Other Ambulatory Visit: Payer: Medicare HMO

## 2018-07-08 DIAGNOSIS — M810 Age-related osteoporosis without current pathological fracture: Secondary | ICD-10-CM

## 2018-07-09 LAB — CALCIUM: Calcium: 9.5 mg/dL (ref 8.6–10.4)

## 2018-07-22 ENCOUNTER — Encounter: Payer: Self-pay | Admitting: Gynecology

## 2018-07-22 DIAGNOSIS — Z1231 Encounter for screening mammogram for malignant neoplasm of breast: Secondary | ICD-10-CM | POA: Diagnosis not present

## 2018-08-04 ENCOUNTER — Other Ambulatory Visit: Payer: Self-pay

## 2018-08-04 ENCOUNTER — Ambulatory Visit (INDEPENDENT_AMBULATORY_CARE_PROVIDER_SITE_OTHER): Payer: Medicare HMO | Admitting: Gynecology

## 2018-08-04 DIAGNOSIS — M810 Age-related osteoporosis without current pathological fracture: Secondary | ICD-10-CM | POA: Diagnosis not present

## 2018-08-04 MED ORDER — DENOSUMAB 60 MG/ML ~~LOC~~ SOSY
60.0000 mg | PREFILLED_SYRINGE | Freq: Once | SUBCUTANEOUS | Status: AC
  Administered 2018-08-04: 60 mg via SUBCUTANEOUS

## 2018-09-20 ENCOUNTER — Other Ambulatory Visit: Payer: Self-pay

## 2018-09-20 ENCOUNTER — Encounter (HOSPITAL_BASED_OUTPATIENT_CLINIC_OR_DEPARTMENT_OTHER): Payer: Self-pay

## 2018-09-20 ENCOUNTER — Emergency Department (HOSPITAL_BASED_OUTPATIENT_CLINIC_OR_DEPARTMENT_OTHER)
Admission: EM | Admit: 2018-09-20 | Discharge: 2018-09-20 | Disposition: A | Discharge status: Home / Self Care | Payer: Medicare HMO | Attending: Emergency Medicine | Admitting: Emergency Medicine

## 2018-09-20 DIAGNOSIS — Y92010 Kitchen of single-family (private) house as the place of occurrence of the external cause: Secondary | ICD-10-CM | POA: Insufficient documentation

## 2018-09-20 DIAGNOSIS — Y998 Other external cause status: Secondary | ICD-10-CM | POA: Insufficient documentation

## 2018-09-20 DIAGNOSIS — S6992XA Unspecified injury of left wrist, hand and finger(s), initial encounter: Secondary | ICD-10-CM | POA: Diagnosis present

## 2018-09-20 DIAGNOSIS — Y93G1 Activity, food preparation and clean up: Secondary | ICD-10-CM | POA: Diagnosis not present

## 2018-09-20 DIAGNOSIS — Z79899 Other long term (current) drug therapy: Secondary | ICD-10-CM | POA: Diagnosis not present

## 2018-09-20 DIAGNOSIS — Z23 Encounter for immunization: Secondary | ICD-10-CM | POA: Diagnosis not present

## 2018-09-20 DIAGNOSIS — S61012A Laceration without foreign body of left thumb without damage to nail, initial encounter: Secondary | ICD-10-CM

## 2018-09-20 DIAGNOSIS — W260XXA Contact with knife, initial encounter: Secondary | ICD-10-CM | POA: Diagnosis not present

## 2018-09-20 MED ORDER — TETANUS-DIPHTH-ACELL PERTUSSIS 5-2.5-18.5 LF-MCG/0.5 IM SUSP
0.5000 mL | Freq: Once | INTRAMUSCULAR | Status: AC
  Administered 2018-09-20: 0.5 mL via INTRAMUSCULAR
  Filled 2018-09-20: qty 0.5

## 2018-09-20 NOTE — ED Notes (Signed)
ED Provider at bedside Applying dermabond

## 2018-09-20 NOTE — ED Provider Notes (Signed)
[facility] EMERGENCY DEPARTMENT Provider Note   CSN: [id number] Arrival date & time: 09/20/18  1856     History   Chief Complaint Chief Complaint  Patient presents with  . Finger Injury    HPI Doris Hanson is a 68 y.o. female.     The history is provided by the patient.  Laceration Location:  Finger (cutting an onion and sliced her thumb) Finger laceration location:  L thumb Length:  0.5cm Depth:  Cutaneous Quality: straight   Bleeding: controlled   Time since incident:  2 hours Laceration mechanism:  Knife Pain details:    Quality:  Aching   Severity:  Mild   Timing:  Constant   Progression:  Improving Foreign body present:  No foreign bodies Relieved by:  Pressure Worsened by:  Nothing Ineffective treatments:  None tried Tetanus status:  Out of date Associated symptoms: no focal weakness, no numbness, no redness and no swelling     Past Medical History:  Diagnosis Date  . Atrophic vaginitis   . Ectopic pregnancy 1984  . Endometriosis   . Fibrocystic breast   . Osteoporosis 06/2017   T score -2.6 improved from prior DEXA    Patient Active Problem List   Diagnosis Date Noted  . Atrophic vaginitis   . Diabetes mellitus   . Ectopic pregnancy     Past Surgical History:  Procedure Laterality Date  . BREAST BIOPSY  1984  . BREAST BIOPSY  1993  . Broken arm    . Broken leg    . DILATION AND CURETTAGE OF UTERUS    . DX LAP WITH LEFT SALPINGOSTOMY  1984   MICROSURGICAL FIMROPLASTY, MICROSURG LYSIS OF ADHESIONS, /ETOPIC PREG  . ECTOPIC PREGNANCY SURGERY    . OOPHORECTOMY     LSO  . PELVIC LAPAROSCOPY     DL L Salpingectomy-lysis of adhesions  . TONSILLECTOMY  1960     OB History    Gravida  2   Para      Term      Preterm      AB  2   Living  0     SAB      TAB      Ectopic  1   Multiple      Live Births               Home Medications    Prior to Admission medications   Medication Sig Start Date End  Date Taking? Authorizing Provider  Calcium Carbonate-Vitamin D (CALTRATE 600+D PO) Take by mouth.      [provider]  clobetasol cream (TEMOVATE) 2.20 % Apply 1 application topically 2 (two) times daily.    [provider]  denosumab (PROLIA) 60 MG/ML SOLN injection Inject 60 mg into the skin every 6 (six) months. Administer in upper arm, thigh, or abdomen    [provider]  Multiple Vitamin (MULTIVITAMIN) capsule Take 1 capsule by mouth daily.     [provider]  NONFORMULARY OR COMPOUNDED ITEM vaginal estrogen cream 0.02 % twice weekly Patient not taking: Reported on 05/05/2017 03/15/16   Fontaine, Belinda Block, MD  Omega-3 Fatty Acids (FISH OIL PO) Take by mouth.      [provider]    Family History Family History  Problem Relation Age of Onset  . Osteoporosis Mother   . Stroke Mother   . Hypertension Father   . Diabetes Father   . Hyperlipidemia Father   .  Dementia Father   . Hypertension Sister   . Rheum arthritis Sister   . Osteoporosis Maternal Grandmother   . Hypertension Paternal Grandmother   . Diabetes Paternal Uncle   . Hypertension Paternal Uncle     Social History Social History   Tobacco Use  . Smoking status: Never Smoker  . Smokeless tobacco: Never Used  Substance Use Topics  . Alcohol use: Yes    Comment: Occas  . Drug use: No     Allergies   Hydrocodone   Review of Systems Review of Systems  Neurological: Negative for focal weakness.  All other systems reviewed and are negative.    Physical Exam Updated Vital Signs BP (!) 167/97 (BP Location: Left Arm)   Pulse 82   Temp 98.6 F (37 C) (Oral)   Resp 18   Ht 5\' 2"  (1.575 m)   Wt 52.2 kg   SpO2 100%   BMI 21.03 kg/m   Physical Exam Vitals signs and nursing note reviewed.  Constitutional:      General: She is not in acute distress.    Appearance: Normal appearance. She is normal weight.  HENT:     Head: Normocephalic.  Cardiovascular:      Rate and Rhythm: Normal rate.     Pulses: Normal pulses.  Pulmonary:     Effort: Pulmonary effort is normal.  Musculoskeletal:       Hands:  Skin:    General: Skin is warm.  Neurological:     General: No focal deficit present.     Mental Status: She is alert and oriented to person, place, and time. Mental status is at baseline.  Psychiatric:        Mood and Affect: Mood normal.        Behavior: Behavior normal.        Thought Content: Thought content normal.      ED Treatments / Results  Labs (all labs ordered are listed, but only abnormal results are displayed) Labs Reviewed - No data to display  EKG None  Radiology No results found.  Procedures Procedures (including critical care time) LACERATION REPAIR Performed by: [facility] Authorized by: Gwyneth Sprout Consent: Verbal consent obtained. Risks and benefits: risks, benefits and alternatives were discussed Consent given by: patient Patient identity confirmed: provided demographic data Prepped and Draped in normal sterile fashion Wound explored  Laceration Location: left thumb  Laceration Length: 0.5cm  No Foreign Bodies seen or palpated  Anesthesia: none Irrigation method: syringe Amount of cleaning: standard  Skin closure: dermabond  Patient tolerance: Patient tolerated the procedure well with no immediate complications.   Medications Ordered in ED Medications  Tdap (BOOSTRIX) injection 0.5 mL (has no administration in time range)     Initial Impression / Assessment and Plan / ED Course  I have reviewed the triage vital signs and the nursing notes.  Pertinent labs & imaging results that were available during my care of the patient were reviewed by me and considered in my medical decision making (see chart for details).        Patient with a small laceration to the left thumb that is now well approximated and has no bleeding.  Wound repaired with Dermabond and patient's tetanus  shot was updated.  Final Clinical Impressions(s) / ED Diagnoses   Final diagnoses:  Laceration of left thumb without foreign body without damage to nail, initial encounter    ED Discharge Orders    None  Gwyneth SproutPlunkett, Emersynn Deatley, MD 09/20/18 2034

## 2018-09-20 NOTE — Discharge Instructions (Addendum)
The glue can get wet but make sure you are not soaking or scrubbing right on it.

## 2018-09-20 NOTE — ED Triage Notes (Signed)
Pt lacerated her thumb with a knife while cooking. Dressing in place to control bleeding. Small lac present

## 2018-09-20 NOTE — ED Notes (Signed)
ED Provider at bedside. 

## 2018-10-28 ENCOUNTER — Encounter: Payer: Self-pay | Admitting: Gynecology

## 2018-11-03 DIAGNOSIS — M810 Age-related osteoporosis without current pathological fracture: Secondary | ICD-10-CM | POA: Diagnosis not present

## 2018-11-03 DIAGNOSIS — Z1159 Encounter for screening for other viral diseases: Secondary | ICD-10-CM | POA: Diagnosis not present

## 2018-11-03 DIAGNOSIS — B351 Tinea unguium: Secondary | ICD-10-CM | POA: Diagnosis not present

## 2018-11-03 DIAGNOSIS — Z0000 Encounter for general adult medical examination without abnormal findings: Secondary | ICD-10-CM | POA: Diagnosis not present

## 2018-11-03 DIAGNOSIS — R69 Illness, unspecified: Secondary | ICD-10-CM | POA: Diagnosis not present

## 2018-11-03 DIAGNOSIS — R739 Hyperglycemia, unspecified: Secondary | ICD-10-CM | POA: Diagnosis not present

## 2018-11-03 DIAGNOSIS — Z79899 Other long term (current) drug therapy: Secondary | ICD-10-CM | POA: Diagnosis not present

## 2018-11-06 DIAGNOSIS — Z1159 Encounter for screening for other viral diseases: Secondary | ICD-10-CM | POA: Diagnosis not present

## 2018-11-06 DIAGNOSIS — Z0000 Encounter for general adult medical examination without abnormal findings: Secondary | ICD-10-CM | POA: Diagnosis not present

## 2018-11-06 DIAGNOSIS — R739 Hyperglycemia, unspecified: Secondary | ICD-10-CM | POA: Diagnosis not present

## 2018-11-06 DIAGNOSIS — B351 Tinea unguium: Secondary | ICD-10-CM | POA: Diagnosis not present

## 2018-11-06 DIAGNOSIS — Z79899 Other long term (current) drug therapy: Secondary | ICD-10-CM | POA: Diagnosis not present

## 2018-11-06 DIAGNOSIS — M859 Disorder of bone density and structure, unspecified: Secondary | ICD-10-CM | POA: Diagnosis not present

## 2018-11-06 NOTE — Telephone Encounter (Signed)
Prolia Given 08/07/2018 Next injection 02/08/2019

## 2018-11-09 ENCOUNTER — Encounter: Payer: Self-pay | Admitting: Gynecology

## 2018-12-22 DIAGNOSIS — Z23 Encounter for immunization: Secondary | ICD-10-CM | POA: Diagnosis not present

## 2019-02-01 ENCOUNTER — Telehealth: Payer: Self-pay | Admitting: *Deleted

## 2019-02-01 NOTE — Telephone Encounter (Addendum)
Deductible N/A  OOP MAX $5000 ($69met)  Annual exam 04/29/2018 TF  Calcium   9.5          Date 07/08/2018  Upcoming dental procedures   Prior Authorization needed   Pt estimated Cost $238  APPT 02/08/2019     Coverage Details: 20% one dose, $25 admin fee

## 2019-02-08 ENCOUNTER — Other Ambulatory Visit: Payer: Self-pay

## 2019-02-08 ENCOUNTER — Ambulatory Visit: Payer: Medicare HMO

## 2019-02-08 NOTE — Telephone Encounter (Signed)
Pt came in today for Prolia injection  Decided to get injection another due to copay of $238. There was discrepancy regarding Prolia card. Prolia card does not work with Medicare Last year copay was paid with Debit card due to Medicare only insurance .  Will wait on patient to make appt for Prolia injection.  Has to be done before 07/08/2019 or patient needs another calcium level

## 2019-02-20 DIAGNOSIS — Z20828 Contact with and (suspected) exposure to other viral communicable diseases: Secondary | ICD-10-CM | POA: Diagnosis not present

## 2019-02-26 NOTE — Telephone Encounter (Signed)
Pt left VM stating husband has COVID she is quarantining with him will call me back to set up Prolia

## 2019-03-12 NOTE — Telephone Encounter (Signed)
Prolia appt 03/15/2019

## 2019-03-15 ENCOUNTER — Ambulatory Visit (INDEPENDENT_AMBULATORY_CARE_PROVIDER_SITE_OTHER): Payer: Medicare HMO | Admitting: Gynecology

## 2019-03-15 ENCOUNTER — Other Ambulatory Visit: Payer: Self-pay

## 2019-03-15 DIAGNOSIS — M810 Age-related osteoporosis without current pathological fracture: Secondary | ICD-10-CM | POA: Diagnosis not present

## 2019-03-15 MED ORDER — DENOSUMAB 60 MG/ML ~~LOC~~ SOSY
60.0000 mg | PREFILLED_SYRINGE | Freq: Once | SUBCUTANEOUS | Status: AC
  Administered 2019-03-15: 60 mg via SUBCUTANEOUS

## 2019-04-30 ENCOUNTER — Encounter: Payer: Medicare HMO | Admitting: Obstetrics and Gynecology

## 2019-04-30 ENCOUNTER — Other Ambulatory Visit: Payer: Self-pay

## 2019-05-03 ENCOUNTER — Other Ambulatory Visit: Payer: Self-pay

## 2019-05-03 ENCOUNTER — Ambulatory Visit: Payer: Medicare HMO | Admitting: Obstetrics and Gynecology

## 2019-05-03 ENCOUNTER — Encounter: Payer: Self-pay | Admitting: Obstetrics and Gynecology

## 2019-05-03 VITALS — BP 124/84 | Ht 62.0 in | Wt 115.0 lb

## 2019-05-03 DIAGNOSIS — Z01419 Encounter for gynecological examination (general) (routine) without abnormal findings: Secondary | ICD-10-CM | POA: Diagnosis not present

## 2019-05-03 DIAGNOSIS — M810 Age-related osteoporosis without current pathological fracture: Secondary | ICD-10-CM

## 2019-05-03 NOTE — Progress Notes (Signed)
   Eboni Coval 26-Oct-1950 [id number]  SUBJECTIVE:  69 y.o. G2P0020 female for annual routine breast and pelvic exam. She has no gynecologic concerns.  Current Outpatient Medications  Medication Sig Dispense Refill  . Calcium Carbonate-Vitamin D (CALTRATE 600+D PO) Take by mouth.      . denosumab (PROLIA) 60 MG/ML SOLN injection Inject 60 mg into the skin every 6 (six) months. Administer in upper arm, thigh, or abdomen    . Multiple Vitamin (MULTIVITAMIN) capsule Take 1 capsule by mouth daily.     . Omega-3 Fatty Acids (FISH OIL PO) Take by mouth.      . clobetasol cream (TEMOVATE) 0.05 % Apply 1 application topically 2 (two) times daily.    . NONFORMULARY OR COMPOUNDED ITEM vaginal estrogen cream 0.02 % twice weekly (Patient not taking: Reported on 05/05/2017) 90 each 3   No current facility-administered medications for this visit.   Allergies: Hydrocodone  No LMP recorded. Patient is postmenopausal.  Past medical history,surgical history, problem list, medications, allergies, family history and social history were all reviewed and documented as reviewed in the EPIC chart.  ROS:  Feeling well. No dyspnea or chest pain on exertion.  No abdominal pain, change in bowel habits, black or bloody stools.  No urinary tract symptoms. GYN ROS:  no abnormal bleeding, pelvic pain or discharge, no breast pain or new or enlarging lumps on self exam. No neurological complaints.   OBJECTIVE:  BP 124/84   Ht 5\' 2"  (1.575 m)   Wt 115 lb (52.2 kg)   BMI 21.03 kg/m  The patient appears well, alert, oriented x 3, in no distress. ENT normal.  Neck supple. No cervical or supraclavicular adenopathy or thyromegaly.  Lungs are clear, good air entry, no wheezes, rhonchi or rales. S1 and S2 normal, no murmurs, regular rate and rhythm.  Abdomen soft without tenderness, guarding, mass or organomegaly.  Neurological is normal, no focal findings.  BREAST EXAM: breasts appear normal, no suspicious masses, no  skin or nipple changes or axillary nodes  PELVIC EXAM: VULVA: normal appearing vulva with no masses, tenderness or lesions, VAGINA: normal appearing vagina with normal color and discharge, no lesions, CERVIX: normal appearing cervix without discharge or lesions, UTERUS: uterus is normal size, shape, consistency and nontender, ADNEXA: normal adnexa in size, nontender and no masses  Chaperone: present during the examination  ASSESSMENT:  69 y.o. G2P0020 here for annual gynecologic exam  PLAN:   1. Postmenopausal.  No hormonal concerns.  No vaginal bleeding. 2. Pap smear 04/2017.  No history of abnormal Pap smears.  Option to discontinue screening at age 61 or if she desires to continue, the next Pap smear would be due 2022 following the current guidelines recommending the 3 year interval. 3. Mammogram 07/2018.  Normal breast exam today.  She is reminded to schedule an annual mammogram this year when due. 4. Colonoscopy 2011.  Recommended that she follow up at the recommended interval which would probably be this year. 5. Postmenopausal osteoporosis.  DEXA 2019 T score -2.6.  Continues on Prolia for approximately 6 years. Next DEXA recommended in 2-3 months for the 2 year follow up so she plans to schedule this when due. 6. Health maintenance.  No labs today as she normally has these completed with her primary care provider.  She will plan to have her PCP office for the lab results of this.  Return annually or sooner, prn.  2020 MD, FACOG  05/03/19

## 2019-06-02 ENCOUNTER — Encounter: Payer: Self-pay | Admitting: Gastroenterology

## 2019-06-29 ENCOUNTER — Encounter: Payer: Self-pay | Admitting: Obstetrics and Gynecology

## 2019-06-29 NOTE — Telephone Encounter (Signed)
PROLIA GIVEN 03/15/2019 NEXT INJECTION 09/13/2019

## 2019-06-30 ENCOUNTER — Ambulatory Visit (AMBULATORY_SURGERY_CENTER): Payer: Self-pay | Admitting: *Deleted

## 2019-06-30 ENCOUNTER — Other Ambulatory Visit: Payer: Self-pay

## 2019-06-30 VITALS — Ht 62.0 in | Wt 117.0 lb

## 2019-06-30 DIAGNOSIS — Z1211 Encounter for screening for malignant neoplasm of colon: Secondary | ICD-10-CM

## 2019-06-30 NOTE — Progress Notes (Signed)
No egg or soy allergy known to patient   issues with past sedation with any surgeries  or procedures of PONV with general anesthesia- , no intubation problems  No diet pills per patient No home 02 use per patient  No blood thinners per patient  Pt denies issues with constipation  No A fib or A flutter  EMMI video sent to pt's e mail   03-29-2019 comp covid vaccines   Due to the COVID-19 pandemic we are asking patients to follow these guidelines. Please only bring one care partner. Please be aware that your care partner may wait in the car in the parking lot or if they feel like they will be too hot to wait in the car, they may wait in the lobby on the 4th floor. All care partners are required to wear a mask the entire time (we do not have any that we can provide them), they need to practice social distancing, and we will do a Covid check for all patient's and care partners when you arrive. Also we will check their temperature and your temperature. If the care partner waits in their car they need to stay in the parking lot the entire time and we will call them on their cell phone when the patient is ready for discharge so they can bring the car to the front of the building. Also all patient's will need to wear a mask into building.

## 2019-07-16 ENCOUNTER — Encounter: Payer: Self-pay | Admitting: Gastroenterology

## 2019-07-30 ENCOUNTER — Other Ambulatory Visit: Payer: Self-pay

## 2019-07-30 ENCOUNTER — Encounter: Payer: Self-pay | Admitting: Gastroenterology

## 2019-07-30 ENCOUNTER — Ambulatory Visit (AMBULATORY_SURGERY_CENTER): Payer: Medicare HMO | Admitting: Gastroenterology

## 2019-07-30 VITALS — BP 106/51 | HR 58 | Temp 96.8°F | Resp 16 | Ht 62.0 in | Wt 117.0 lb

## 2019-07-30 DIAGNOSIS — Z1211 Encounter for screening for malignant neoplasm of colon: Secondary | ICD-10-CM | POA: Diagnosis not present

## 2019-07-30 MED ORDER — SODIUM CHLORIDE 0.9 % IV SOLN: 500.0000 mL | Freq: Once | INTRAVENOUS | Status: DC

## 2019-07-30 NOTE — Patient Instructions (Signed)
Your next colonoscopy should occur in 10 years.    You may resume your previous diet and medication schedule.  Thank you for allowing us to care for you today!!!   YOU HAD AN ENDOSCOPIC PROCEDURE TODAY AT THE [facility]:   Refer to the procedure report that was given to you for any specific questions about what was found during the examination.  If the procedure report does not answer your questions, please call your gastroenterologist to clarify.  If you requested that your care partner not be given the details of your procedure findings, then the procedure report has been included in a sealed envelope for you to review at your convenience later.  YOU SHOULD EXPECT: Some feelings of bloating in the abdomen. Passage of more gas than usual.  Walking can help get rid of the air that was put into your GI tract during the procedure and reduce the bloating. If you had a lower endoscopy (such as a colonoscopy or flexible sigmoidoscopy) you may notice spotting of blood in your stool or on the toilet paper. If you underwent a bowel prep for your procedure, you may not have a normal bowel movement for a few days.  Please Note:  You might notice some irritation and congestion in your nose or some drainage.  This is from the oxygen used during your procedure.  There is no need for concern and it should clear up in a day or so.  SYMPTOMS TO REPORT IMMEDIATELY:   Following lower endoscopy (colonoscopy or flexible sigmoidoscopy):  Excessive amounts of blood in the stool  Significant tenderness or worsening of abdominal pains  Swelling of the abdomen that is new, acute  Fever of 100F or higher  For urgent or emergent issues, a gastroenterologist can be reached at any hour by calling [phone]. Do not use MyChart messaging for urgent concerns.    DIET:  We do recommend a small meal at first, but then you may proceed to your regular diet.  Drink plenty of fluids but you should avoid  alcoholic beverages for 24 hours.  ACTIVITY:  You should plan to take it easy for the rest of today and you should NOT DRIVE or use heavy machinery until tomorrow (because of the sedation medicines used during the test).    FOLLOW UP: Our staff will call the number listed on your records 48-72 hours following your procedure to check on you and address any questions or concerns that you may have regarding the information given to you following your procedure. If we do not reach you, we will leave a message.  We will attempt to reach you two times.  During this call, we will ask if you have developed any symptoms of COVID 19. If you develop any symptoms (ie: fever, flu-like symptoms, shortness of breath, cough etc.) before then, please call [phone].  If you test positive for Covid 19 in the 2 weeks post procedure, please call and report this information to us.    If any biopsies were taken you will be contacted by phone or by letter within the next 1-3 weeks.  Please call us at [phone] if you have not heard about the biopsies in 3 weeks.    SIGNATURES/CONFIDENTIALITY: You and/or your care partner have signed paperwork which will be entered into your electronic medical record.  These signatures attest to the fact that that the information above on your After Visit Summary has been reviewed and is understood.  Full   responsibility of the confidentiality of this discharge information lies with you and/or your care-partner. 

## 2019-07-30 NOTE — Progress Notes (Signed)
PT taken to PACU. Monitors in place. VSS. Report given to RN. 

## 2019-07-30 NOTE — Progress Notes (Signed)
VS by CW  Pt's states no medical or surgical changes since previsit or office visit.  

## 2019-07-30 NOTE — Op Note (Signed)
[facility] Patient Name: Doris Hanson Procedure Date: 07/30/2019 9:27 AM MRN: [id number] Endoscopist: Rachael Fee , MD Age: 69 Referring MD:  Date of Birth: 1950/03/25 Gender: Female Account #: [id number] Procedure:                Colonoscopy Indications:              Screening for colorectal malignant neoplasm Medicines:                Monitored Anesthesia Care Procedure:                Pre-Anesthesia Assessment:                           - Prior to the procedure, a History and Physical                            was performed, and patient medications and                            allergies were reviewed. The patient's tolerance of                            previous anesthesia was also reviewed. The risks                            and benefits of the procedure and the sedation                            options and risks were discussed with the patient.                            All questions were answered, and informed consent                            was obtained. Prior Anticoagulants: The patient has                            taken no previous anticoagulant or antiplatelet                            agents. ASA Grade Assessment: II - A patient with                            mild systemic disease. After reviewing the risks                            and benefits, the patient was deemed in                            satisfactory condition to undergo the procedure.                           After obtaining informed consent, the colonoscope  was passed under direct vision. Throughout the                            procedure, the patient's blood pressure, pulse, and                            oxygen saturations were monitored continuously. The                            Colonoscope was introduced through the anus and                            advanced to the the cecum, identified by                            appendiceal orifice and  ileocecal valve. The                            colonoscopy was performed without difficulty. The                            patient tolerated the procedure well. The quality                            of the bowel preparation was good. The ileocecal                            valve, appendiceal orifice, and rectum were                            photographed. Scope In: 9:30:09 AM Scope Out: 9:42:12 AM Scope Withdrawal Time: 0 hours 5 minutes 33 seconds  Total Procedure Duration: 0 hours 12 minutes 3 seconds  Findings:                 The entire examined colon appeared normal on direct                            and retroflexion views. Complications:            No immediate complications. Estimated blood loss:                            None. Estimated Blood Loss:     Estimated blood loss: none. Impression:               - The entire examined colon is normal on direct and                            retroflexion views.                           - No polyps or cancers. Recommendation:           - Patient has a contact number available for  emergencies. The signs and symptoms of potential                            delayed complications were discussed with the                            patient. Return to normal activities tomorrow.                            Written discharge instructions were provided to the                            patient.                           - Resume previous diet.                           - Continue present medications.                           - Repeat colonoscopy in 10 years for screening. Rachael Fee, MD 07/30/2019 9:46:17 AM This report has been signed electronically.

## 2019-08-03 ENCOUNTER — Telehealth: Payer: Self-pay

## 2019-08-03 ENCOUNTER — Telehealth: Payer: Self-pay | Admitting: *Deleted

## 2019-08-03 NOTE — Telephone Encounter (Signed)
1st follow up call made.  NALM 

## 2019-08-03 NOTE — Telephone Encounter (Signed)
Left message on f/u call 

## 2019-08-05 ENCOUNTER — Encounter: Payer: Self-pay | Admitting: Obstetrics and Gynecology

## 2019-08-05 DIAGNOSIS — M8589 Other specified disorders of bone density and structure, multiple sites: Secondary | ICD-10-CM | POA: Diagnosis not present

## 2019-08-05 DIAGNOSIS — Z1231 Encounter for screening mammogram for malignant neoplasm of breast: Secondary | ICD-10-CM | POA: Diagnosis not present

## 2019-08-10 ENCOUNTER — Encounter: Payer: Self-pay | Admitting: Gynecology

## 2019-08-20 DIAGNOSIS — Z6821 Body mass index (BMI) 21.0-21.9, adult: Secondary | ICD-10-CM | POA: Diagnosis not present

## 2019-08-20 DIAGNOSIS — M1811 Unilateral primary osteoarthritis of first carpometacarpal joint, right hand: Secondary | ICD-10-CM | POA: Diagnosis not present

## 2019-08-20 DIAGNOSIS — M8580 Other specified disorders of bone density and structure, unspecified site: Secondary | ICD-10-CM | POA: Diagnosis not present

## 2019-08-30 ENCOUNTER — Telehealth: Payer: Self-pay | Admitting: *Deleted

## 2019-08-30 DIAGNOSIS — M810 Age-related osteoporosis without current pathological fracture: Secondary | ICD-10-CM

## 2019-08-30 NOTE — Telephone Encounter (Addendum)
Deductible N/A  OOP MAX $5000 ($237.94MET)  Annual exam 05/03/2019  Calcium  9.6           Date appt 09/10/2019  Upcoming dental procedures   Prior Authorization needed yes approved 08/30/2019-08/29/2020  Pt estimated Cost $222    APPT 09/15/2019   Coverage Details:20% one dose, 20% admin fee

## 2019-09-08 DIAGNOSIS — M65311 Trigger thumb, right thumb: Secondary | ICD-10-CM | POA: Diagnosis not present

## 2019-09-08 DIAGNOSIS — M1811 Unilateral primary osteoarthritis of first carpometacarpal joint, right hand: Secondary | ICD-10-CM | POA: Diagnosis not present

## 2019-09-10 ENCOUNTER — Other Ambulatory Visit: Payer: Medicare HMO

## 2019-09-10 ENCOUNTER — Other Ambulatory Visit: Payer: Self-pay | Admitting: *Deleted

## 2019-09-10 ENCOUNTER — Other Ambulatory Visit: Payer: Self-pay

## 2019-09-10 DIAGNOSIS — M810 Age-related osteoporosis without current pathological fracture: Secondary | ICD-10-CM

## 2019-09-10 LAB — CALCIUM: Calcium: 9.6 mg/dL (ref 8.6–10.4)

## 2019-09-15 ENCOUNTER — Ambulatory Visit (INDEPENDENT_AMBULATORY_CARE_PROVIDER_SITE_OTHER): Payer: Medicare HMO | Admitting: Gynecology

## 2019-09-15 ENCOUNTER — Other Ambulatory Visit: Payer: Self-pay

## 2019-09-15 DIAGNOSIS — M810 Age-related osteoporosis without current pathological fracture: Secondary | ICD-10-CM

## 2019-09-15 MED ORDER — DENOSUMAB 60 MG/ML ~~LOC~~ SOSY
60.0000 mg | PREFILLED_SYRINGE | Freq: Once | SUBCUTANEOUS | Status: AC
  Administered 2019-09-15: 60 mg via SUBCUTANEOUS

## 2019-10-13 DIAGNOSIS — M65311 Trigger thumb, right thumb: Secondary | ICD-10-CM | POA: Diagnosis not present

## 2019-10-13 DIAGNOSIS — M1811 Unilateral primary osteoarthritis of first carpometacarpal joint, right hand: Secondary | ICD-10-CM | POA: Diagnosis not present

## 2019-10-25 DIAGNOSIS — R69 Illness, unspecified: Secondary | ICD-10-CM | POA: Diagnosis not present

## 2020-03-06 ENCOUNTER — Telehealth: Payer: Self-pay | Admitting: *Deleted

## 2020-03-06 NOTE — Telephone Encounter (Signed)
Left message for pt to call me back regarding Prolia. Letter came back saying insurance was terminated on 02/20/20

## 2020-03-06 NOTE — Telephone Encounter (Signed)
PROLIA GIVEN 09/15/2019 NEXT INJECTION 03/18/2020 

## 2020-03-07 NOTE — Telephone Encounter (Signed)
New ID # for [employer]  [id number]  Called [employer] to update in system waiting on new summary of benefits

## 2020-03-16 NOTE — Telephone Encounter (Addendum)
Deductible n/a  OOP MAX $4500((7met)  Annual exam 05/03/2019 jk  Calcium 9.6            Date 09/10/2019  Upcoming dental procedures   Prior Authorization needed APPROVED the one on file was extended  Pt estimated Cost $238   appt 03/30/2020    Coverage Details: 20% one dose,$25 admin fee

## 2020-03-30 ENCOUNTER — Other Ambulatory Visit: Payer: Self-pay

## 2020-03-30 ENCOUNTER — Ambulatory Visit (INDEPENDENT_AMBULATORY_CARE_PROVIDER_SITE_OTHER): Payer: Medicare HMO | Admitting: *Deleted

## 2020-03-30 DIAGNOSIS — M810 Age-related osteoporosis without current pathological fracture: Secondary | ICD-10-CM | POA: Diagnosis not present

## 2020-03-30 MED ORDER — DENOSUMAB 60 MG/ML ~~LOC~~ SOSY
60.0000 mg | PREFILLED_SYRINGE | Freq: Once | SUBCUTANEOUS | Status: AC
  Administered 2020-03-30: 60 mg via SUBCUTANEOUS

## 2020-04-12 DIAGNOSIS — M1811 Unilateral primary osteoarthritis of first carpometacarpal joint, right hand: Secondary | ICD-10-CM | POA: Diagnosis not present

## 2020-04-12 DIAGNOSIS — M65311 Trigger thumb, right thumb: Secondary | ICD-10-CM | POA: Diagnosis not present

## 2020-05-25 ENCOUNTER — Encounter: Payer: Self-pay | Admitting: Obstetrics & Gynecology

## 2020-05-25 ENCOUNTER — Ambulatory Visit (INDEPENDENT_AMBULATORY_CARE_PROVIDER_SITE_OTHER): Payer: Medicare HMO | Admitting: Obstetrics & Gynecology

## 2020-05-25 ENCOUNTER — Other Ambulatory Visit: Payer: Self-pay

## 2020-05-25 ENCOUNTER — Other Ambulatory Visit (HOSPITAL_COMMUNITY)
Admission: RE | Admit: 2020-05-25 | Discharge: 2020-05-25 | Disposition: A | Discharge status: Home / Self Care | Payer: Medicare HMO | Source: Ambulatory Visit | Attending: Obstetrics & Gynecology | Admitting: Obstetrics & Gynecology

## 2020-05-25 VITALS — BP 122/74 | Ht 61.5 in | Wt 110.0 lb

## 2020-05-25 DIAGNOSIS — M8588 Other specified disorders of bone density and structure, other site: Secondary | ICD-10-CM | POA: Diagnosis not present

## 2020-05-25 DIAGNOSIS — Z780 Asymptomatic menopausal state: Secondary | ICD-10-CM | POA: Diagnosis not present

## 2020-05-25 DIAGNOSIS — Z01419 Encounter for gynecological examination (general) (routine) without abnormal findings: Secondary | ICD-10-CM | POA: Insufficient documentation

## 2020-05-25 NOTE — Progress Notes (Signed)
Korayma Hagwood [birth year] [id number]   History:    70 y.o. G2P0A2  RP:  Established patient presenting for annual gyn exam   HPI: Postmenopausal.  No hormonal concerns.  No vaginal bleeding.  No pelvic pain. Pap smear 04/2017.  No history of abnormal Pap smears. Breasts normal.  Mammogram 07/2019.  Colonoscopy 2021.  Postmenopausal osteoporosis. DEXA [facility] 07/2019 T score -2.4.  All sites stable. Continues on Prolia.  No labs today as she normally has these completed with her primary care provider.  BMI 20.45.  Active physically.   Past medical history,surgical history, family history and social history were all reviewed and documented in the EPIC chart.  Gynecologic History No LMP recorded. Patient is postmenopausal.  Obstetric History OB History  Gravida Para Term Preterm AB Living  2       2 0  SAB IAB Ectopic Multiple Live Births      1        # Outcome Date GA Lbr Len/2nd Weight Sex Delivery Anes PTL Lv  2 Ectopic           1 AB              ROS: A ROS was performed and pertinent positives and negatives are included in the history.  GENERAL: No fevers or chills. HEENT: No change in vision, no earache, sore throat or sinus congestion. NECK: No pain or stiffness. CARDIOVASCULAR: No chest pain or pressure. No palpitations. PULMONARY: No shortness of breath, cough or wheeze. GASTROINTESTINAL: No abdominal pain, nausea, vomiting or diarrhea, melena or bright red blood per rectum. GENITOURINARY: No urinary frequency, urgency, hesitancy or dysuria. MUSCULOSKELETAL: No joint or muscle pain, no back pain, no recent trauma. DERMATOLOGIC: No rash, no itching, no lesions. ENDOCRINE: No polyuria, polydipsia, no heat or cold intolerance. No recent change in weight. HEMATOLOGICAL: No anemia or easy bruising or bleeding. NEUROLOGIC: No headache, seizures, numbness, tingling or weakness. PSYCHIATRIC: No depression, no loss of interest in normal activity or change in sleep pattern.      Exam:   BP 122/74   Ht 5' 1.5" (1.562 m)   Wt 110 lb (49.9 kg)   BMI 20.45 kg/m   Body mass index is 20.45 kg/m.  General appearance : Well developed well nourished female. No acute distress HEENT: Eyes: no retinal hemorrhage or exudates,  Neck supple, trachea midline, no carotid bruits, no thyroidmegaly Lungs: Clear to auscultation, no rhonchi or wheezes, or rib retractions  Heart: Regular rate and rhythm, no murmurs or gallops Breast:Examined in sitting and supine position were symmetrical in appearance, no palpable masses or tenderness,  no skin retraction, no nipple inversion, no nipple discharge, no skin discoloration, no axillary or supraclavicular lymphadenopathy Abdomen: no palpable masses or tenderness, no rebound or guarding Extremities: no edema or skin discoloration or tenderness  Pelvic: Vulva: Normal             Vagina: No gross lesions or discharge  Cervix: No gross lesions or discharge.  Pap reflex done.  Uterus  AV, normal size, shape and consistency, non-tender and mobile  Adnexa  Without masses or tenderness  Anus: Normal   Assessment/Plan:  70 y.o. female for annual exam   1. Encounter for routine gynecological examination with Papanicolaou smear of cervix Normal gynecologic exam in menopause.  Pap reflex done.  Breast exam normal.  Screening mammogram July 2021 was negative.  Colonoscopy 2021.  Good body mass index at 20.45.  Continue with fitness and  healthy nutrition.  Health labs with family physician. - Cytology - PAP( [facility])  2. Postmenopause Well on no hormone replacement therapy.  No postmenopausal  3. Osteopenia of lumbar spine Osteoporosis on Prolia.  Last bone density at [facility] July 2021 showed stable bone density with the lowest T score at -2.4.  We will continue on Prolia.  Vitamin D supplements, calcium intake total of 1.5 g/day and regular weightbearing physical activities.  Genia Del MD, 12:08 PM 05/25/2020

## 2020-05-26 LAB — CYTOLOGY - PAP: Diagnosis: NEGATIVE

## 2020-05-28 ENCOUNTER — Encounter: Payer: Self-pay | Admitting: Obstetrics & Gynecology

## 2020-06-13 DIAGNOSIS — H52223 Regular astigmatism, bilateral: Secondary | ICD-10-CM | POA: Diagnosis not present

## 2020-08-01 DIAGNOSIS — Z0000 Encounter for general adult medical examination without abnormal findings: Secondary | ICD-10-CM | POA: Diagnosis not present

## 2020-08-01 DIAGNOSIS — Z1389 Encounter for screening for other disorder: Secondary | ICD-10-CM | POA: Diagnosis not present

## 2020-08-04 DIAGNOSIS — M810 Age-related osteoporosis without current pathological fracture: Secondary | ICD-10-CM | POA: Diagnosis not present

## 2020-08-04 DIAGNOSIS — R739 Hyperglycemia, unspecified: Secondary | ICD-10-CM | POA: Diagnosis not present

## 2020-08-04 DIAGNOSIS — Z79899 Other long term (current) drug therapy: Secondary | ICD-10-CM | POA: Diagnosis not present

## 2020-08-04 DIAGNOSIS — Z136 Encounter for screening for cardiovascular disorders: Secondary | ICD-10-CM | POA: Diagnosis not present

## 2020-08-04 DIAGNOSIS — Z0000 Encounter for general adult medical examination without abnormal findings: Secondary | ICD-10-CM | POA: Diagnosis not present

## 2020-08-04 DIAGNOSIS — Z23 Encounter for immunization: Secondary | ICD-10-CM | POA: Diagnosis not present

## 2020-08-15 ENCOUNTER — Encounter: Payer: Self-pay | Admitting: Obstetrics & Gynecology

## 2020-08-15 DIAGNOSIS — Z1231 Encounter for screening mammogram for malignant neoplasm of breast: Secondary | ICD-10-CM | POA: Diagnosis not present

## 2020-09-06 ENCOUNTER — Telehealth: Payer: Self-pay | Admitting: *Deleted

## 2020-09-06 DIAGNOSIS — M810 Age-related osteoporosis without current pathological fracture: Secondary | ICD-10-CM

## 2020-09-06 NOTE — Telephone Encounter (Addendum)
Deductible N/A  OOP MAX $4500 ($284.61 MET)  Annual exam 05/25/2020 05/25/2020  Calcium  9.9           Date 09/28/2020  Upcoming dental procedures NO  Prior Authorization needed YES valid 09/07/2020-09/07/2021  Pt estimated Cost $222   APPT  10/05/2020    Coverage Details: 20% one dose, 20% admin fee

## 2020-09-06 NOTE — Telephone Encounter (Signed)
PROLIA GIVEN 03/30/2020 NEXT INJECTION 10/01/2020

## 2020-09-28 ENCOUNTER — Other Ambulatory Visit: Payer: Medicare HMO

## 2020-09-28 ENCOUNTER — Other Ambulatory Visit: Payer: Self-pay

## 2020-09-28 DIAGNOSIS — M810 Age-related osteoporosis without current pathological fracture: Secondary | ICD-10-CM

## 2020-09-28 LAB — CALCIUM: Calcium: 9.9 mg/dL (ref 8.6–10.4)

## 2020-10-03 ENCOUNTER — Other Ambulatory Visit: Payer: Self-pay

## 2020-10-03 ENCOUNTER — Ambulatory Visit (INDEPENDENT_AMBULATORY_CARE_PROVIDER_SITE_OTHER): Payer: Medicare HMO | Admitting: Gynecology

## 2020-10-03 DIAGNOSIS — M810 Age-related osteoporosis without current pathological fracture: Secondary | ICD-10-CM | POA: Diagnosis not present

## 2020-10-03 MED ORDER — DENOSUMAB 60 MG/ML ~~LOC~~ SOSY
60.0000 mg | PREFILLED_SYRINGE | Freq: Once | SUBCUTANEOUS | Status: AC
  Administered 2020-10-03: 60 mg via SUBCUTANEOUS

## 2020-10-05 ENCOUNTER — Ambulatory Visit: Payer: Medicare HMO

## 2020-11-09 DIAGNOSIS — E789 Disorder of lipoprotein metabolism, unspecified: Secondary | ICD-10-CM | POA: Diagnosis not present

## 2020-11-13 DIAGNOSIS — M1811 Unilateral primary osteoarthritis of first carpometacarpal joint, right hand: Secondary | ICD-10-CM | POA: Diagnosis not present

## 2020-11-13 DIAGNOSIS — M65311 Trigger thumb, right thumb: Secondary | ICD-10-CM | POA: Diagnosis not present

## 2020-12-06 NOTE — Telephone Encounter (Signed)
PROLIA GIVEN 10/03/2020 NEXT INJECTION 04/05/2021

## 2021-01-17 DIAGNOSIS — M1811 Unilateral primary osteoarthritis of first carpometacarpal joint, right hand: Secondary | ICD-10-CM | POA: Diagnosis not present

## 2021-01-17 DIAGNOSIS — M65311 Trigger thumb, right thumb: Secondary | ICD-10-CM | POA: Diagnosis not present

## 2021-03-29 DIAGNOSIS — M25569 Pain in unspecified knee: Secondary | ICD-10-CM | POA: Diagnosis not present

## 2021-04-03 ENCOUNTER — Telehealth: Payer: Self-pay | Admitting: *Deleted

## 2021-04-03 NOTE — Telephone Encounter (Signed)
Message left to return call to Tatianna Ibbotson at [phone].   

## 2021-04-03 NOTE — Telephone Encounter (Signed)
Deductible: N/A ? ?OOP MAX: N/A ? ?Annual exam: 05-25-20 ML ? ?Calcium: 9.9     Date: 09-28-20 ? ?Upcoming dental procedures  ? ?Prior Authorization needed: Yes, on file. Good through 09-07-21. Authorization #: [id number] ? ?Pt estimated Cost: ~$301 ? ? ? ? ?Coverage Details: Prolia is subject to 20% coinsurance and $4,500 out of pocket max ($0.00 met). Once met, Prolia will then be covered at 100%.  ? ?

## 2021-04-11 NOTE — Telephone Encounter (Signed)
Left message for pt to return our call. 

## 2021-04-11 NOTE — Telephone Encounter (Signed)
APPT 04/12/2021 ?

## 2021-04-12 ENCOUNTER — Ambulatory Visit (INDEPENDENT_AMBULATORY_CARE_PROVIDER_SITE_OTHER): Payer: Medicare HMO

## 2021-04-12 ENCOUNTER — Other Ambulatory Visit: Payer: Self-pay

## 2021-04-12 DIAGNOSIS — M810 Age-related osteoporosis without current pathological fracture: Secondary | ICD-10-CM | POA: Diagnosis not present

## 2021-04-12 MED ORDER — DENOSUMAB 60 MG/ML ~~LOC~~ SOSY
60.0000 mg | PREFILLED_SYRINGE | Freq: Once | SUBCUTANEOUS | Status: AC
  Administered 2021-04-12: 60 mg via SUBCUTANEOUS

## 2021-04-20 DIAGNOSIS — M25562 Pain in left knee: Secondary | ICD-10-CM | POA: Diagnosis not present

## 2021-04-23 ENCOUNTER — Other Ambulatory Visit: Payer: Self-pay | Admitting: Sports Medicine

## 2021-04-23 ENCOUNTER — Ambulatory Visit
Admission: RE | Admit: 2021-04-23 | Discharge: 2021-04-23 | Disposition: A | Discharge status: Home / Self Care | Payer: Medicare HMO | Source: Ambulatory Visit | Attending: Sports Medicine | Admitting: Sports Medicine

## 2021-04-23 DIAGNOSIS — M25562 Pain in left knee: Secondary | ICD-10-CM

## 2021-05-24 DIAGNOSIS — M25562 Pain in left knee: Secondary | ICD-10-CM | POA: Diagnosis not present

## 2021-05-30 ENCOUNTER — Ambulatory Visit (INDEPENDENT_AMBULATORY_CARE_PROVIDER_SITE_OTHER): Payer: Medicare HMO | Admitting: Obstetrics & Gynecology

## 2021-05-30 ENCOUNTER — Encounter: Payer: Self-pay | Admitting: Obstetrics & Gynecology

## 2021-05-30 VITALS — BP 120/68 | HR 72 | Resp 16 | Ht 61.75 in | Wt 112.0 lb

## 2021-05-30 DIAGNOSIS — Z780 Asymptomatic menopausal state: Secondary | ICD-10-CM

## 2021-05-30 DIAGNOSIS — Z01419 Encounter for gynecological examination (general) (routine) without abnormal findings: Secondary | ICD-10-CM | POA: Diagnosis not present

## 2021-05-30 DIAGNOSIS — M810 Age-related osteoporosis without current pathological fracture: Secondary | ICD-10-CM

## 2021-05-30 NOTE — Progress Notes (Signed)
? ? ?Doris Hanson 1950/07/13 [id number] ? ? ?History:    71 y.o.  G2P0A2  Married.  1 adopted daughter. ?  ?RP:  Established patient presenting for annual gyn exam  ?  ?HPI: Postmenopausal.  No hormonal concerns.  No vaginal bleeding.  No pelvic pain. Pap smear Neg 05/2020. Will repeat at 5 yrs.  No history of abnormal Pap smears. Breasts normal.  Mammogram 07/2020.  Colonoscopy 2021.  Postmenopausal osteoporosis.  DEXA [facility] 07/2019 T score -2.4.  All sites stable.  Continues on Prolia, last injection 03/2021.  Will repeat BD at [facility] 07/2021. No labs today as she normally has these completed with her primary care provider.  BMI 20.65.  Active physically. ? ?Past medical history,surgical history, family history and social history were all reviewed and documented in the EPIC chart. ? ?Gynecologic History ?No LMP recorded. Patient is postmenopausal. ? ?Obstetric History ?OB History  ?Gravida Para Term Preterm AB Living  ?2       2 0  ?SAB IAB Ectopic Multiple Live Births  ?1   1      ?  ?# Outcome Date GA Lbr Len/2nd Weight Sex Delivery Anes PTL Lv  ?2 Ectopic           ?1 SAB           ?  ?Obstetric Comments  ?1 adopted daughter  ? ? ? ?ROS: A ROS was performed and pertinent positives and negatives are included in the history. ? GENERAL: No fevers or chills. HEENT: No change in vision, no earache, sore throat or sinus congestion. NECK: No pain or stiffness. CARDIOVASCULAR: No chest pain or pressure. No palpitations. PULMONARY: No shortness of breath, cough or wheeze. GASTROINTESTINAL: No abdominal pain, nausea, vomiting or diarrhea, melena or bright red blood per rectum. GENITOURINARY: No urinary frequency, urgency, hesitancy or dysuria. MUSCULOSKELETAL: No joint or muscle pain, no back pain, no recent trauma. DERMATOLOGIC: No rash, no itching, no lesions. ENDOCRINE: No polyuria, polydipsia, no heat or cold intolerance. No recent change in weight. HEMATOLOGICAL: No anemia or easy bruising or bleeding. NEUROLOGIC: No  headache, seizures, numbness, tingling or weakness. PSYCHIATRIC: No depression, no loss of interest in normal activity or change in sleep pattern.  ?  ? ?Exam: ? ? ?BP 120/68   Pulse 72   Resp 16   Ht 5' 1.75" (1.568 m)   Wt 112 lb (50.8 kg)   BMI 20.65 kg/m?  ? ?Body mass index is 20.65 kg/m?. ? ?General appearance : Well developed well nourished female. No acute distress ?HEENT: Eyes: no retinal hemorrhage or exudates,  Neck supple, trachea midline, no carotid bruits, no thyroidmegaly ?Lungs: Clear to auscultation, no rhonchi or wheezes, or rib retractions  ?Heart: Regular rate and rhythm, no murmurs or gallops ?Breast:Examined in sitting and supine position were symmetrical in appearance, no palpable masses or tenderness,  no skin retraction, no nipple inversion, no nipple discharge, no skin discoloration, no axillary or supraclavicular lymphadenopathy ?Abdomen: no palpable masses or tenderness, no rebound or guarding ?Extremities: no edema or skin discoloration or tenderness ? ?Pelvic: Vulva: Normal ?            Vagina: No gross lesions or discharge ? Cervix: No gross lesions or discharge ? Uterus  AV, normal size, shape and consistency, non-tender and mobile ? Adnexa  Without masses or tenderness ? Anus: Normal ? ? ?Assessment/Plan:  71 y.o. female for annual exam  ? ?1. Well female exam with routine gynecological exam ?Postmenopausal.  No hormonal concerns.  No vaginal bleeding.  No pelvic pain. Pap smear Neg 05/2020. Will repeat at 5 yrs.  No history of abnormal Pap smears. Breasts normal.  Mammogram 07/2020.  Colonoscopy 2021.  Postmenopausal osteoporosis.  DEXA [facility] 07/2019 T score -2.4.  All sites stable.  Continues on Prolia, last injection 03/2021.  Will repeat BD at [facility] 07/2021. No labs today as she normally has these completed with her primary care provider.  BMI 20.65.  Active physically. ? ?2. Postmenopause ?Postmenopausal.  No hormonal concerns.  No vaginal bleeding.  No pelvic pain. ? ?3.  Age-related osteoporosis without current pathological fracture ?Postmenopausal osteoporosis.  DEXA [facility] 07/2019 T score -2.4.  All sites stable.  Continues on Prolia, last injection 03/2021.  Will repeat BD at [facility] 07/2021.  Continue on Vit D, Ca++, wt bearing activities. ? ?Other orders ?- rosuvastatin (CRESTOR) 5 MG tablet; Take 5 mg by mouth daily.  ? ?Princess Bruins MD, 9:33 AM 05/30/2021 ? ?  ?

## 2021-06-14 NOTE — Telephone Encounter (Signed)
Prolia given 04/12/2021 Next injection 10/15/2021

## 2021-07-09 DIAGNOSIS — M1811 Unilateral primary osteoarthritis of first carpometacarpal joint, right hand: Secondary | ICD-10-CM | POA: Diagnosis not present

## 2021-07-09 DIAGNOSIS — M65311 Trigger thumb, right thumb: Secondary | ICD-10-CM | POA: Diagnosis not present

## 2021-08-08 DIAGNOSIS — R7303 Prediabetes: Secondary | ICD-10-CM | POA: Diagnosis not present

## 2021-08-08 DIAGNOSIS — Z6820 Body mass index (BMI) 20.0-20.9, adult: Secondary | ICD-10-CM | POA: Diagnosis not present

## 2021-08-08 DIAGNOSIS — E7800 Pure hypercholesterolemia, unspecified: Secondary | ICD-10-CM | POA: Diagnosis not present

## 2021-08-08 DIAGNOSIS — M8589 Other specified disorders of bone density and structure, multiple sites: Secondary | ICD-10-CM | POA: Diagnosis not present

## 2021-08-17 DIAGNOSIS — Z780 Asymptomatic menopausal state: Secondary | ICD-10-CM | POA: Diagnosis not present

## 2021-08-17 DIAGNOSIS — M8589 Other specified disorders of bone density and structure, multiple sites: Secondary | ICD-10-CM | POA: Diagnosis not present

## 2021-08-17 DIAGNOSIS — Z1231 Encounter for screening mammogram for malignant neoplasm of breast: Secondary | ICD-10-CM | POA: Diagnosis not present

## 2021-09-28 ENCOUNTER — Telehealth: Payer: Self-pay | Admitting: *Deleted

## 2021-09-28 DIAGNOSIS — M810 Age-related osteoporosis without current pathological fracture: Secondary | ICD-10-CM

## 2021-09-28 NOTE — Telephone Encounter (Addendum)
Deductible n/a  OOP MAX 74142 ($349.14)  Annual exam 05/30/2021  Calcium  9.5           Date 10/12/2021  Upcoming dental procedures NO  Prior Authorization APPROVED AUTH [id number] valid 09/28/2021-09/29/2022  Pt estimated Cost 301    Appt 10/17/2021   Coverage Details: 20 % one dose, 20% admin fee

## 2021-10-12 ENCOUNTER — Other Ambulatory Visit: Payer: Medicare HMO

## 2021-10-12 DIAGNOSIS — M810 Age-related osteoporosis without current pathological fracture: Secondary | ICD-10-CM | POA: Diagnosis not present

## 2021-10-13 LAB — CALCIUM: Calcium: 9.5 mg/dL (ref 8.6–10.4)

## 2021-10-15 NOTE — Telephone Encounter (Signed)
Calcium level was normal at 9.5  Patient asked about when she will have Prolia injection. I told her I will route to [facility] manager. Routed to [employer]

## 2021-10-17 ENCOUNTER — Ambulatory Visit (INDEPENDENT_AMBULATORY_CARE_PROVIDER_SITE_OTHER): Payer: Medicare HMO | Admitting: *Deleted

## 2021-10-17 DIAGNOSIS — M810 Age-related osteoporosis without current pathological fracture: Secondary | ICD-10-CM | POA: Diagnosis not present

## 2021-10-17 MED ORDER — DENOSUMAB 60 MG/ML ~~LOC~~ SOSY
60.0000 mg | PREFILLED_SYRINGE | Freq: Once | SUBCUTANEOUS | Status: AC
  Administered 2021-10-17: 60 mg via SUBCUTANEOUS

## 2022-02-27 DIAGNOSIS — M25562 Pain in left knee: Secondary | ICD-10-CM | POA: Diagnosis not present

## 2022-02-28 ENCOUNTER — Encounter: Payer: Self-pay | Admitting: Obstetrics & Gynecology

## 2022-03-27 DIAGNOSIS — M1712 Unilateral primary osteoarthritis, left knee: Secondary | ICD-10-CM | POA: Diagnosis not present

## 2022-03-28 ENCOUNTER — Telehealth: Payer: Self-pay | Admitting: *Deleted

## 2022-03-28 NOTE — Telephone Encounter (Signed)
Deductible n/a  OOP MAX $4500 (0.76)  Annual exam 05/30/2021 ml   Calcium 9.5            Date 10/12/2021  Upcoming dental procedures? no  Is Prior Authorization needed  APPROVED AUTH [id number] valid 09/28/2021-09/29/2022   Pt estimated Cost  $301  Appt 04/24/2022     Coverage Details: 20% one dose 20% admin fee

## 2022-04-24 ENCOUNTER — Ambulatory Visit (INDEPENDENT_AMBULATORY_CARE_PROVIDER_SITE_OTHER): Payer: Medicare HMO | Admitting: *Deleted

## 2022-04-24 DIAGNOSIS — M810 Age-related osteoporosis without current pathological fracture: Secondary | ICD-10-CM | POA: Diagnosis not present

## 2022-04-24 MED ORDER — DENOSUMAB 60 MG/ML ~~LOC~~ SOSY
60.0000 mg | PREFILLED_SYRINGE | Freq: Once | SUBCUTANEOUS | Status: AC
Start: 2022-04-24 — End: 2022-04-24
  Administered 2022-04-24: 60 mg via SUBCUTANEOUS

## 2022-05-08 DIAGNOSIS — R030 Elevated blood-pressure reading, without diagnosis of hypertension: Secondary | ICD-10-CM | POA: Diagnosis not present

## 2022-05-08 DIAGNOSIS — L089 Local infection of the skin and subcutaneous tissue, unspecified: Secondary | ICD-10-CM | POA: Diagnosis not present

## 2022-06-05 DIAGNOSIS — M1712 Unilateral primary osteoarthritis, left knee: Secondary | ICD-10-CM | POA: Diagnosis not present

## 2022-07-07 IMAGING — CR DG KNEE 3 VIEWS*L*
3 series · 3 of 3 positions shown · non-contrast
Comparison: None.

CLINICAL DATA: Left medial knee pain x 3 weeks.

EXAM:
LEFT KNEE - 3 VIEW

[w knee ap left * (1 of 2)]
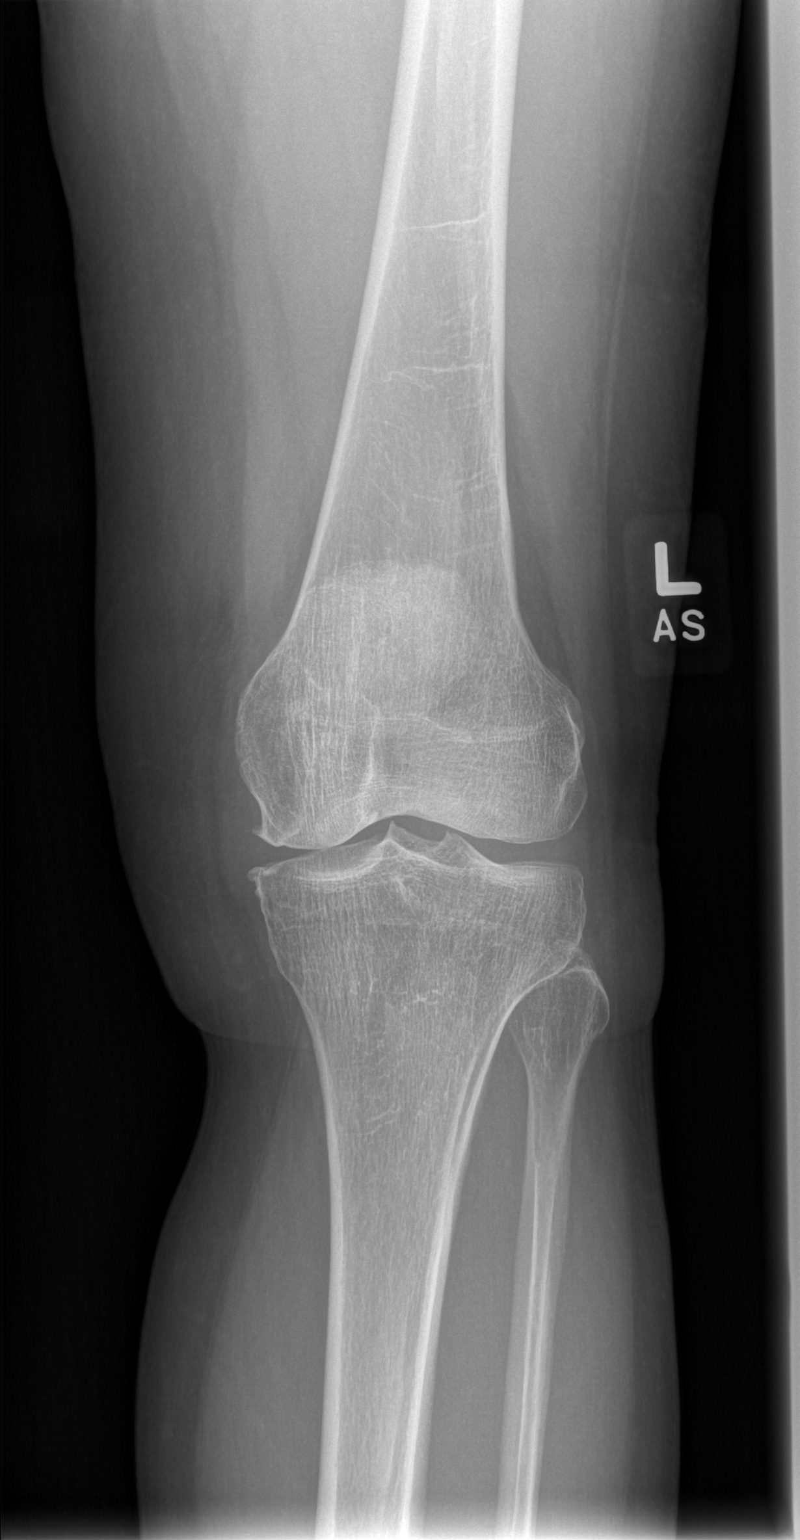

[w knee lat. left *]
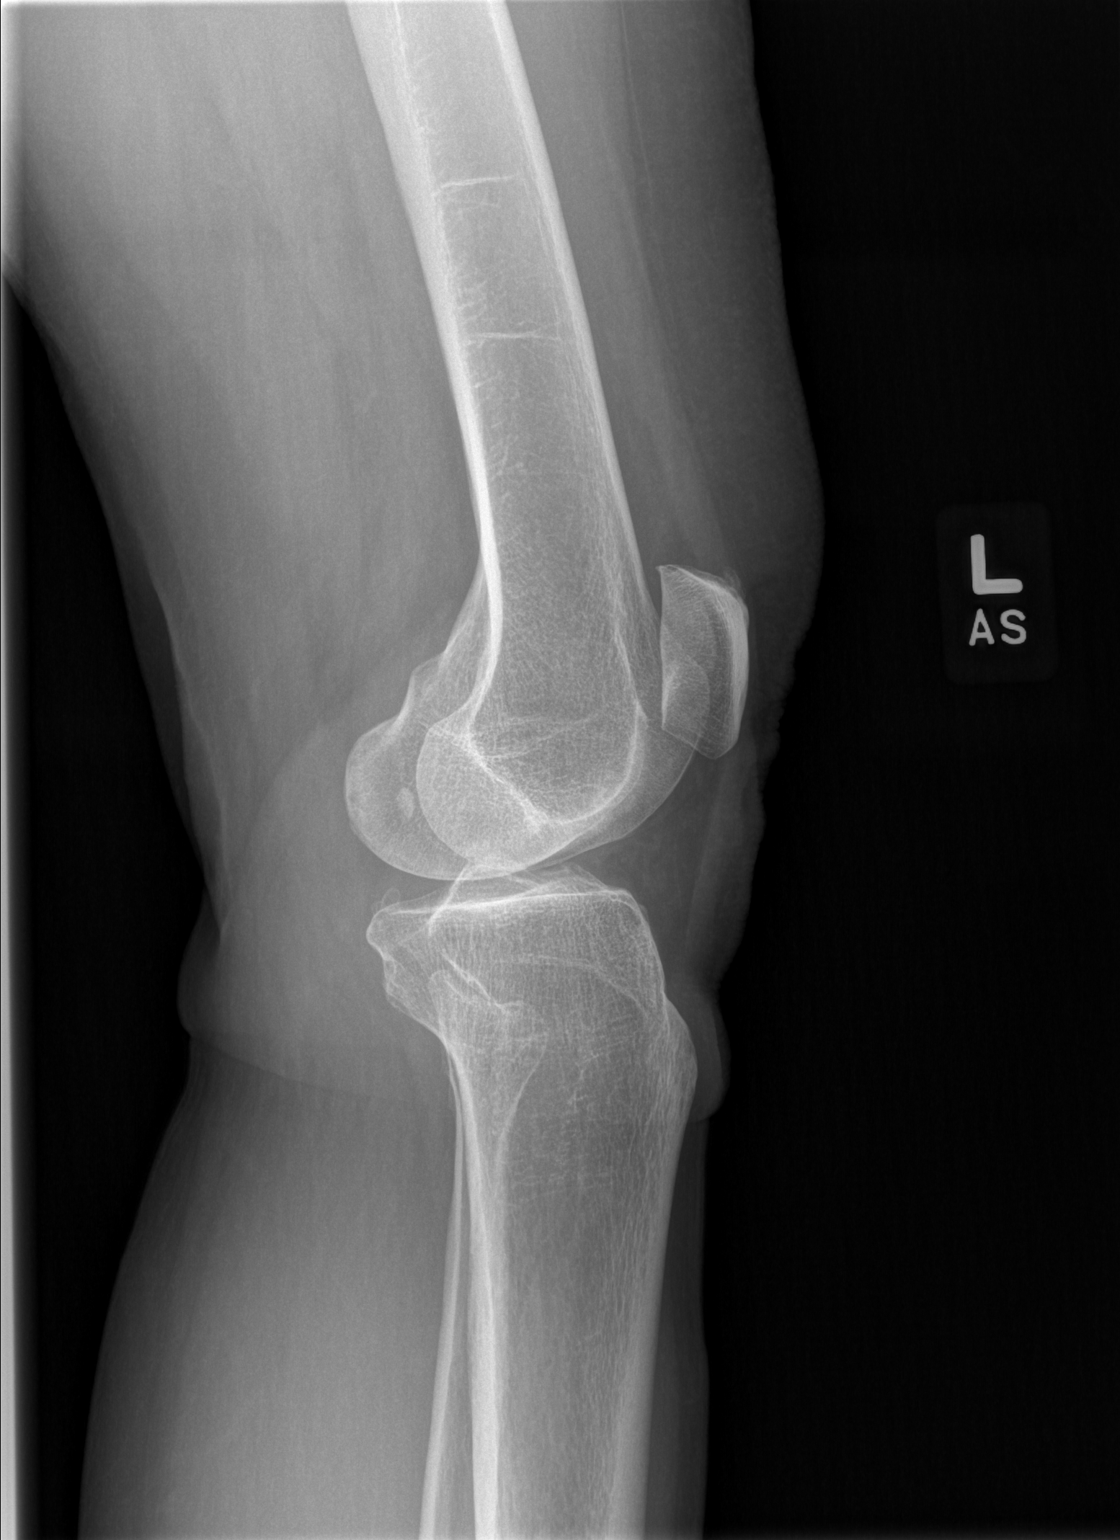

[w knee ap left * (2 of 2)]
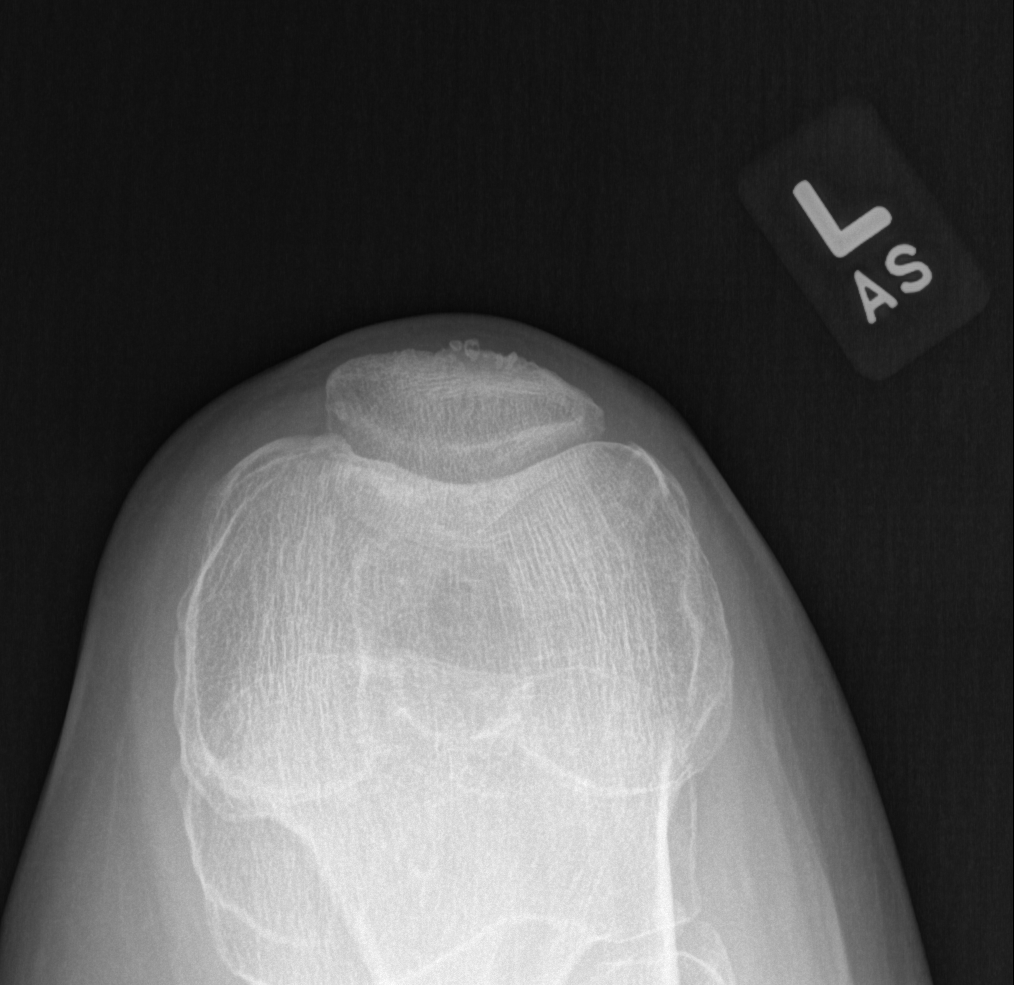

[3 of 3 positions shown; findings below may reference images not displayed]

FINDINGS: No evidence of fracture, dislocation, or joint effusion. Joint
spaces are well maintained. There are minimal medial compartment
osteophytes. Soft tissues are unremarkable.
IMPRESSION: 1. No acute bony abnormality.
2. Mild degenerative changes of the medial compartment.

## 2022-08-14 DIAGNOSIS — E7800 Pure hypercholesterolemia, unspecified: Secondary | ICD-10-CM | POA: Diagnosis not present

## 2022-08-14 DIAGNOSIS — M8589 Other specified disorders of bone density and structure, multiple sites: Secondary | ICD-10-CM | POA: Diagnosis not present

## 2022-08-14 DIAGNOSIS — Z6820 Body mass index (BMI) 20.0-20.9, adult: Secondary | ICD-10-CM | POA: Diagnosis not present

## 2022-08-14 DIAGNOSIS — I700 Atherosclerosis of aorta: Secondary | ICD-10-CM | POA: Diagnosis not present

## 2022-08-14 DIAGNOSIS — R7303 Prediabetes: Secondary | ICD-10-CM | POA: Diagnosis not present

## 2022-08-30 DIAGNOSIS — Z1231 Encounter for screening mammogram for malignant neoplasm of breast: Secondary | ICD-10-CM | POA: Diagnosis not present

## 2022-09-24 DIAGNOSIS — M1990 Unspecified osteoarthritis, unspecified site: Secondary | ICD-10-CM | POA: Diagnosis not present

## 2022-09-24 DIAGNOSIS — Z823 Family history of stroke: Secondary | ICD-10-CM | POA: Diagnosis not present

## 2022-09-24 DIAGNOSIS — Z833 Family history of diabetes mellitus: Secondary | ICD-10-CM | POA: Diagnosis not present

## 2022-09-24 DIAGNOSIS — Z008 Encounter for other general examination: Secondary | ICD-10-CM | POA: Diagnosis not present

## 2022-09-24 DIAGNOSIS — M810 Age-related osteoporosis without current pathological fracture: Secondary | ICD-10-CM | POA: Diagnosis not present

## 2022-09-24 DIAGNOSIS — Z809 Family history of malignant neoplasm, unspecified: Secondary | ICD-10-CM | POA: Diagnosis not present

## 2022-09-24 DIAGNOSIS — Z791 Long term (current) use of non-steroidal anti-inflammatories (NSAID): Secondary | ICD-10-CM | POA: Diagnosis not present

## 2022-09-24 DIAGNOSIS — E785 Hyperlipidemia, unspecified: Secondary | ICD-10-CM | POA: Diagnosis not present

## 2022-09-24 DIAGNOSIS — Z8249 Family history of ischemic heart disease and other diseases of the circulatory system: Secondary | ICD-10-CM | POA: Diagnosis not present

## 2022-09-25 DIAGNOSIS — M1712 Unilateral primary osteoarthritis, left knee: Secondary | ICD-10-CM | POA: Diagnosis not present

## 2022-11-26 DIAGNOSIS — R051 Acute cough: Secondary | ICD-10-CM | POA: Diagnosis not present

## 2022-11-28 ENCOUNTER — Telehealth: Payer: Self-pay | Admitting: *Deleted

## 2022-11-28 DIAGNOSIS — M810 Age-related osteoporosis without current pathological fracture: Secondary | ICD-10-CM

## 2022-11-28 NOTE — Telephone Encounter (Signed)
Insurance information submitted to [employer] portal. Will await summary of benefits for prolia.    

## 2022-12-05 NOTE — Telephone Encounter (Signed)
Please advise, last annual exam was 05-30-21 with Dr. Seymour Bars, and BMD was last 07/2021 at [facility] Called [facility] and will be faxing report. Please advise if patient needs annual prior to scheduling BMD?  Routing to provider for review.

## 2022-12-05 NOTE — Telephone Encounter (Signed)
She is due 05/2023 for breast and pelvic exam

## 2022-12-07 NOTE — Telephone Encounter (Signed)
Patient needs yearly office visit with provider for evaluation and treatment of her bone health.  She is receiving Prolia through our office. Last injection was 04/24/22.  I just received in Epic a copy of her bone density from [place] from 08/17/21.   Her breast and pelvic exam is done every 2 years and will be due in May, 2025.

## 2022-12-10 NOTE — Telephone Encounter (Signed)
PA for prolia signed by Clearnce Hasten, NP and faxed to [facility] at [phone]. Will await response from insurance.

## 2022-12-10 NOTE — Telephone Encounter (Signed)
Reviewed with Clearnce Hasten, NP who states patient does not need OV prior to prolia. Okay to schedule prolia injection.

## 2022-12-25 MED ORDER — DENOSUMAB 60 MG/ML ~~LOC~~ SOSY
60.0000 mg | PREFILLED_SYRINGE | Freq: Once | SUBCUTANEOUS | Status: AC
Start: 2022-12-31 — End: 2022-12-31
  Administered 2022-12-31: 60 mg via SUBCUTANEOUS

## 2022-12-25 NOTE — Telephone Encounter (Signed)
Call to patient. Patient scheduled for prolia injection on 12-31-22 at 0815. Patient agreeable to date and time of appointment. Summary of benefits scanned into epic. Order placed.   Encounter closed.

## 2022-12-25 NOTE — Telephone Encounter (Signed)
Deductible:  No  OOP MAX: No  Annual exam: 05-30-21 ML, per [facility] patient not due until 05/2023 okay to proceed with prolia  Calcium:     10.4       Date: 08-14-22 via [facility] network   Upcoming dental procedures: No   Hx of Kidney Disease: No   Last Bone Density Scan: 08-17-21   Is Prior Authorization needed: yes  Pt estimated Cost: ~$380    Coverage Details:For the primary Specialty Pharmacy option, Prolia will be subject to a 20% coinsurance up to $4500 OOP max ($324.24 met). Once met, coverage increases to 100%. Administration subject to a $20 copay.

## 2022-12-31 ENCOUNTER — Ambulatory Visit (INDEPENDENT_AMBULATORY_CARE_PROVIDER_SITE_OTHER): Payer: Medicare HMO

## 2022-12-31 DIAGNOSIS — M810 Age-related osteoporosis without current pathological fracture: Secondary | ICD-10-CM | POA: Diagnosis not present

## 2023-03-27 DIAGNOSIS — J209 Acute bronchitis, unspecified: Secondary | ICD-10-CM | POA: Diagnosis not present

## 2023-06-03 ENCOUNTER — Ambulatory Visit (INDEPENDENT_AMBULATORY_CARE_PROVIDER_SITE_OTHER): Admitting: Obstetrics and Gynecology

## 2023-06-03 ENCOUNTER — Encounter: Payer: Self-pay | Admitting: Obstetrics and Gynecology

## 2023-06-03 VITALS — BP 120/76 | HR 84 | Ht 61.5 in | Wt 126.0 lb

## 2023-06-03 DIAGNOSIS — Z01419 Encounter for gynecological examination (general) (routine) without abnormal findings: Secondary | ICD-10-CM | POA: Diagnosis not present

## 2023-06-03 DIAGNOSIS — Z1331 Encounter for screening for depression: Secondary | ICD-10-CM

## 2023-06-03 DIAGNOSIS — E2839 Other primary ovarian failure: Secondary | ICD-10-CM | POA: Diagnosis not present

## 2023-06-03 DIAGNOSIS — Z1231 Encounter for screening mammogram for malignant neoplasm of breast: Secondary | ICD-10-CM

## 2023-06-03 NOTE — Addendum Note (Signed)
 Addended by: Reinaldo Caras on: 06/03/2023 02:29 PM   Modules accepted: Orders

## 2023-06-03 NOTE — Progress Notes (Signed)
 73 y.o. y.o. female here for medicare gyn exam No LMP recorded. Patient is postmenopausal.   G2P0A2  Married.  1 adopted daughter. Has two grandkids   RP:  Established patient presenting for annual gyn exam    HPI: Postmenopausal.  No hormonal concerns.  No vaginal bleeding.  No pelvic pain. Pap smear Neg 05/2020. Will repeat at 3 yrs.  No history of abnormal Pap smears. Breasts normal.  Mammogram 07/2020.  Colonoscopy 2021.  Postmenopausal osteoporosis.  DEXA [facility] 07/2019 T score -2.4.  All sites stable.  Continues on Prolia  q 6 months.   DXA at [facility] 07/2021 -1.90. labs with PMD repeat q2years.  BMI 20.65 at last visit.  Active physically.  Past medical history,surgical history, family history and social history were all reviewed and documented in the EPIC chart. Body mass index is 23.42 kg/m.     06/03/2023    9:38 AM  Depression screen PHQ 2/9  Decreased Interest 0  Down, Depressed, Hopeless 0  PHQ - 2 Score 0    Blood pressure 120/76, pulse 84, height 5' 1.5" (1.562 m), weight 126 lb (57.2 kg), SpO2 97%.     Component Value Date/Time   DIAGPAP  05/25/2020 1226    - Negative for intraepithelial lesion or malignancy (NILM)   ADEQPAP  05/25/2020 1226    Satisfactory for evaluation; transformation zone component PRESENT.    GYN HISTORY:    Component Value Date/Time   DIAGPAP  05/25/2020 1226    - Negative for intraepithelial lesion or malignancy (NILM)   ADEQPAP  05/25/2020 1226    Satisfactory for evaluation; transformation zone component PRESENT.    OB History  Gravida Para Term Preterm AB Living  2    2 0  SAB IAB Ectopic Multiple Live Births  1  1      # Outcome Date GA Lbr Len/2nd Weight Sex Type Anes PTL Lv  2 Ectopic           1 SAB             Obstetric Comments  1 adopted daughter    Past Medical History:  Diagnosis Date   Arthritis of knee    Atrophic vaginitis    Ectopic pregnancy 1984   Endometriosis    Fibrocystic breast    Heart murmur     extra beat dx'd in college    Osteoporosis 06/2017   T score -2.6 improved from prior DEXA   PONV (postoperative nausea and vomiting)    with general anesthesia     Past Surgical History:  Procedure Laterality Date   BREAST BIOPSY  1984   BREAST BIOPSY  1993   Broken arm     Broken leg     COLONOSCOPY  05/24/2009   normal    DILATION AND CURETTAGE OF UTERUS     DX LAP WITH LEFT SALPINGOSTOMY  1984   MICROSURGICAL FIMROPLASTY, MICROSURG LYSIS OF ADHESIONS, /ETOPIC PREG   ECTOPIC PREGNANCY SURGERY     OOPHORECTOMY     LSO   PELVIC LAPAROSCOPY     DL L Salpingectomy-lysis of adhesions   TONSILLECTOMY  1960    Current Outpatient Medications on File Prior to Visit  Medication Sig Dispense Refill   Boswellia-Glucosamine-Vit D (OSTEO BI-FLEX ONE PER DAY) TABS as directed Orally     Calcium Carbonate-Vitamin D  (CALTRATE 600+D PO) Take by mouth.     denosumab  (PROLIA ) 60 MG/ML SOLN injection Inject 60 mg into the skin every  6 (six) months. Administer in upper arm, thigh, or abdomen     Multiple Vitamin (MULTIVITAMIN) capsule Take 1 capsule by mouth daily.     Omega-3 Fatty Acids (FISH OIL PO) Take by mouth.     rosuvastatin (CRESTOR) 5 MG tablet Take 5 mg by mouth daily.     No current facility-administered medications on file prior to visit.    Social History   Socioeconomic History   Marital status: Married    Spouse name: Not on file   Number of children: Not on file   Years of education: Not on file   Highest education level: Not on file  Occupational History   Not on file  Tobacco Use   Smoking status: Never   Smokeless tobacco: Never  Vaping Use   Vaping status: Never Used  Substance and Sexual Activity   Alcohol use: Yes    Comment: Occas wine   Drug use: No   Sexual activity: Not Currently    Partners: Male    Birth control/protection: Post-menopausal    Comment: 1st intercourse 73 yo.--Fewer than 5 partners  Other Topics Concern   Not on file  Social  History Narrative   Not on file   Social Drivers of Health   Financial Resource Strain: Not on file  Food Insecurity: Not on file  Transportation Needs: Not on file  Physical Activity: Not on file  Stress: Not on file  Social Connections: Not on file  Intimate Partner Violence: Not on file    Family History  Problem Relation Age of Onset   Osteoporosis Mother    Stroke Mother    Hypertension Father    Diabetes Father    Hyperlipidemia Father    Dementia Father    Hypertension Sister    Rheum arthritis Sister    Diabetes Paternal Uncle    Hypertension Paternal Uncle    Osteoporosis Maternal Grandmother    Hypertension Paternal Grandmother    Diverticulosis Paternal Grandmother      Allergies  Allergen Reactions   Hydrocodone     REACTION: nausea and vomiting   Hydrocodone-Acetaminophen Nausea Only      Patient's last menstrual period was No LMP recorded. Patient is postmenopausal..            Review of Systems Alls systems reviewed and are negative.     Physical Exam Constitutional:      Appearance: Normal appearance.  Genitourinary:     Vulva and urethral meatus normal.     No lesions in the vagina.     Right Labia: No rash, lesions or skin changes.    Left Labia: No lesions, skin changes or rash.    No vaginal discharge or tenderness.     No vaginal prolapse present.    No vaginal atrophy present.     Right Adnexa: not tender, not palpable and no mass present.    Left Adnexa: absent.    Left Adnexa: not tender, not palpable and no mass present.    No cervical motion tenderness or discharge.     Uterus is not enlarged, tender or irregular.  Breasts:    Right: Normal.     Left: Normal.  HENT:     Head: Normocephalic.  Neck:     Thyroid: No thyroid mass, thyromegaly or thyroid tenderness.  Cardiovascular:     Rate and Rhythm: Normal rate and regular rhythm.     Heart sounds: Normal heart sounds, S1 normal and S2 normal.  Pulmonary:  Effort:  Pulmonary effort is normal.     Breath sounds: Normal breath sounds and air entry.  Abdominal:     General: There is no distension.     Palpations: Abdomen is soft. There is no mass.     Tenderness: There is no abdominal tenderness. There is no guarding or rebound.  Musculoskeletal:        General: Normal range of motion.     Cervical back: Full passive range of motion without pain, normal range of motion and neck supple. No tenderness.     Right lower leg: No edema.     Left lower leg: No edema.  Neurological:     Mental Status: She is alert.  Skin:    General: Skin is warm.  Psychiatric:        Mood and Affect: Mood normal.        Behavior: Behavior normal.        Thought Content: Thought content normal.  Vitals and nursing note reviewed. Exam conducted with a chaperone present.       A:         Medicare gyn exam                             P:        Pap smear collected today Encouraged annual mammogram screening Colon cancer screening up-to-date DXA ordered today Continue prolia . Repeat bone scan q 2 year Labs and immunizations to do with PMD Discussed breast self exams Encouraged healthy lifestyle practices Encouraged Vit D and Calcium   No follow-ups on file.  Reinaldo Caras

## 2023-06-03 NOTE — Patient Instructions (Signed)
 Prolia  will be due in June 2025 from your last shot. Let's repeat your bone scan in July and then see if you would be a good candidate for the evenity for bone building in the following year.   Dr. Tia Flowers

## 2023-06-04 NOTE — Addendum Note (Signed)
 Addended by: Reinaldo Caras on: 06/04/2023 04:58 PM   Modules accepted: Level of Service

## 2023-06-10 ENCOUNTER — Other Ambulatory Visit (HOSPITAL_BASED_OUTPATIENT_CLINIC_OR_DEPARTMENT_OTHER)

## 2023-06-11 ENCOUNTER — Ambulatory Visit (HOSPITAL_BASED_OUTPATIENT_CLINIC_OR_DEPARTMENT_OTHER)
Admission: RE | Admit: 2023-06-11 | Discharge: 2023-06-11 | Disposition: A | Discharge status: Home / Self Care | Source: Ambulatory Visit | Attending: Obstetrics and Gynecology | Admitting: Obstetrics and Gynecology

## 2023-06-11 DIAGNOSIS — E2839 Other primary ovarian failure: Secondary | ICD-10-CM

## 2023-06-26 ENCOUNTER — Other Ambulatory Visit: Payer: Self-pay | Admitting: *Deleted

## 2023-06-26 DIAGNOSIS — M810 Age-related osteoporosis without current pathological fracture: Secondary | ICD-10-CM

## 2023-06-26 MED ORDER — DENOSUMAB 60 MG/ML ~~LOC~~ SOSY
60.0000 mg | PREFILLED_SYRINGE | SUBCUTANEOUS | Status: AC
  Administered 2023-07-07: 60 mg via SUBCUTANEOUS

## 2023-07-07 ENCOUNTER — Ambulatory Visit (INDEPENDENT_AMBULATORY_CARE_PROVIDER_SITE_OTHER): Admitting: *Deleted

## 2023-07-07 DIAGNOSIS — M810 Age-related osteoporosis without current pathological fracture: Secondary | ICD-10-CM | POA: Diagnosis not present

## 2023-09-01 ENCOUNTER — Other Ambulatory Visit (HOSPITAL_BASED_OUTPATIENT_CLINIC_OR_DEPARTMENT_OTHER)

## 2023-09-01 ENCOUNTER — Encounter (HOSPITAL_BASED_OUTPATIENT_CLINIC_OR_DEPARTMENT_OTHER): Payer: Self-pay

## 2023-09-15 DIAGNOSIS — Z1231 Encounter for screening mammogram for malignant neoplasm of breast: Secondary | ICD-10-CM | POA: Diagnosis not present

## 2023-11-11 DIAGNOSIS — M8589 Other specified disorders of bone density and structure, multiple sites: Secondary | ICD-10-CM | POA: Diagnosis not present

## 2023-12-16 ENCOUNTER — Telehealth: Payer: Self-pay

## 2023-12-16 ENCOUNTER — Other Ambulatory Visit: Payer: Self-pay | Admitting: *Deleted

## 2023-12-16 MED ORDER — DENOSUMAB 60 MG/ML ~~LOC~~ SOSY
60.0000 mg | PREFILLED_SYRINGE | SUBCUTANEOUS | Status: AC
  Administered 2024-01-12: 60 mg via SUBCUTANEOUS

## 2023-12-16 NOTE — Telephone Encounter (Signed)
 Prolia  VOB initiated via [email or web address]  Next Prolia  inj DUE: 01/06/24

## 2023-12-17 ENCOUNTER — Other Ambulatory Visit (HOSPITAL_COMMUNITY): Payer: Self-pay

## 2023-12-17 NOTE — Telephone Encounter (Signed)
 Buy/Bill (Office supplied medication)  Out-of-pocket cost due at time of clinic visit: $357  Number of injection/visits approved: 2  Primary: [employer] Co-insurance: 20% Admin fee co-insurance: 20%  Secondary: --- Co-insurance:  Admin fee co-insurance:   Medical Benefit Details: Date Benefits were checked: 12/16/23 Deductible: NO/ Coinsurance: 20%/ Admin Fee: 20%  Prior Auth: APPROVED PA# [id number] Expiration Date: 12/17/23-12/16/24  # of doses approved: 2 -----------------------------------------------------------------------  Patient NOT eligible for Copay Card. Copay Card can make patient's cost as little as $25. Link to apply: [email or web address]  ** This summary of benefits is an estimation of the patient's out-of-pocket cost. Exact cost may very based on individual plan coverage.

## 2023-12-17 NOTE — Telephone Encounter (Addendum)
 MEDICAL PA SUBMITTED VIA [employer]. Authorization Number : [id number]    APPROVED     PHARMACY COPAY: $645.79

## 2023-12-17 NOTE — Telephone Encounter (Signed)
 Doris Hanson

## 2024-01-02 ENCOUNTER — Other Ambulatory Visit

## 2024-01-02 ENCOUNTER — Other Ambulatory Visit: Payer: Self-pay | Admitting: *Deleted

## 2024-01-02 DIAGNOSIS — M810 Age-related osteoporosis without current pathological fracture: Secondary | ICD-10-CM

## 2024-01-03 LAB — CALCIUM: Calcium: 10.3 mg/dL (ref 8.6–10.4)

## 2024-01-05 ENCOUNTER — Ambulatory Visit: Payer: Self-pay | Admitting: Obstetrics and Gynecology

## 2024-01-12 ENCOUNTER — Ambulatory Visit

## 2024-01-12 DIAGNOSIS — M810 Age-related osteoporosis without current pathological fracture: Secondary | ICD-10-CM | POA: Diagnosis not present
